# Patient Record
Sex: Male | Born: 1970 | Race: White | Hispanic: No | Marital: Married | Smoking: Former smoker
Health system: Southern US, Community
[De-identification: ages and names within clinical notes are randomized; demographics above are authoritative.]

## PROBLEM LIST (undated history)

## (undated) DIAGNOSIS — K219 Gastro-esophageal reflux disease without esophagitis: Secondary | ICD-10-CM

## (undated) DIAGNOSIS — T7840XA Allergy, unspecified, initial encounter: Secondary | ICD-10-CM

## (undated) DIAGNOSIS — J449 Chronic obstructive pulmonary disease, unspecified: Secondary | ICD-10-CM

## (undated) DIAGNOSIS — H9192 Unspecified hearing loss, left ear: Secondary | ICD-10-CM

## (undated) DIAGNOSIS — A6920 Lyme disease, unspecified: Secondary | ICD-10-CM

## (undated) DIAGNOSIS — R55 Syncope and collapse: Secondary | ICD-10-CM

## (undated) DIAGNOSIS — E7800 Pure hypercholesterolemia, unspecified: Secondary | ICD-10-CM

## (undated) DIAGNOSIS — G500 Trigeminal neuralgia: Secondary | ICD-10-CM

## (undated) DIAGNOSIS — R569 Unspecified convulsions: Secondary | ICD-10-CM

## (undated) HISTORY — DX: Lyme disease, unspecified: A69.20

## (undated) HISTORY — DX: Unspecified convulsions: R56.9

---
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
## 2013-01-28 ENCOUNTER — Encounter (HOSPITAL_COMMUNITY): Payer: Self-pay | Admitting: Family Medicine

## 2013-01-28 ENCOUNTER — Emergency Department (HOSPITAL_COMMUNITY): Payer: BC Managed Care – PPO

## 2013-01-28 ENCOUNTER — Emergency Department (HOSPITAL_COMMUNITY)
Admission: EM | Admit: 2013-01-28 | Discharge: 2013-01-28 | Disposition: A | Discharge status: Home / Self Care | Payer: BC Managed Care – PPO | Attending: Emergency Medicine | Admitting: Emergency Medicine

## 2013-01-28 DIAGNOSIS — S01311A Laceration without foreign body of right ear, initial encounter: Secondary | ICD-10-CM

## 2013-01-28 DIAGNOSIS — Y92009 Unspecified place in unspecified non-institutional (private) residence as the place of occurrence of the external cause: Secondary | ICD-10-CM | POA: Insufficient documentation

## 2013-01-28 DIAGNOSIS — R0781 Pleurodynia: Secondary | ICD-10-CM

## 2013-01-28 DIAGNOSIS — W19XXXA Unspecified fall, initial encounter: Secondary | ICD-10-CM | POA: Insufficient documentation

## 2013-01-28 DIAGNOSIS — IMO0002 Reserved for concepts with insufficient information to code with codable children: Secondary | ICD-10-CM | POA: Insufficient documentation

## 2013-01-28 DIAGNOSIS — S060X1A Concussion with loss of consciousness of 30 minutes or less, initial encounter: Secondary | ICD-10-CM | POA: Insufficient documentation

## 2013-01-28 DIAGNOSIS — S01309A Unspecified open wound of unspecified ear, initial encounter: Secondary | ICD-10-CM | POA: Insufficient documentation

## 2013-01-28 DIAGNOSIS — Y9389 Activity, other specified: Secondary | ICD-10-CM | POA: Insufficient documentation

## 2013-01-28 DIAGNOSIS — R55 Syncope and collapse: Secondary | ICD-10-CM | POA: Insufficient documentation

## 2013-01-28 LAB — POCT I-STAT, CHEM 8
Calcium, Ion: 1.16 mmol/L (ref 1.12–1.23)
Glucose, Bld: 99 mg/dL (ref 70–99)
HCT: 46 % (ref 39.0–52.0)
Hemoglobin: 15.6 g/dL (ref 13.0–17.0)
Potassium: 4.4 mEq/L (ref 3.5–5.1)

## 2013-01-28 MED ORDER — HYDROCODONE-ACETAMINOPHEN 5-325 MG PO TABS: 2.0000 | ORAL_TABLET | ORAL | Status: DC | PRN

## 2013-01-28 MED ORDER — HYDROCODONE-ACETAMINOPHEN 5-325 MG PO TABS
1.0000 | ORAL_TABLET | Freq: Once | ORAL | Status: AC
  Administered 2013-01-28: 1 via ORAL
  Filled 2013-01-28: qty 1

## 2013-01-28 NOTE — ED Provider Notes (Signed)
History     CSN: [id number]  Arrival date & time 01/28/13  1204   First MD Initiated Contact with Patient 01/28/13 1219      Chief Complaint  Patient presents with  . Loss of Consciousness    (Consider location/radiation/quality/duration/timing/severity/associated sxs/prior treatment) HPI  42 year old male presents for evaluations of a syncopal episode. Patient reports after showering this morning and while changing his clothes, patient heard a ringing sound in his left ear and subsequently found himself on the ground after a suspected syncopal episode. He denies any confusion afterward but complaining of pain and bleeding to his right ear lobe, and right rib pain. He also endorsed a headache on the right side from hitting his head on the wooden floor.  Sts he has been having a sinus infection with intermittent ringing in ear for several weeks.  He has been on steroid and antibiotic for it and just finished the course 5 days ago.  Still endorse occasional ringing in ear. Endorsed decreased hearing to L ear since he was diagnosed with sinus infection.  He also reports having trigeminal type pain for a the past few months and was found to have a crack tooth.  Once the tooth was removed his trigeminal type pain resolved.  However, for the past several weeks he has had pain to his R upper tooth which he concerns may have been the cause of his sinus pain as well.  Pt also reports having strong family hx of cardiac disease.  He also reports having 2 prior episodes of exertional syncope, no prior work up.  Pt is a nonsmoker.  Currently denies fever, chills, vision changes, n/v/d, sob, abd pain, back pain, weakness or numbness.  No prior hx of seizure, no tongue biting or urinary/bowel incontinence.    History reviewed. No pertinent past medical history.  Past Surgical History  Procedure Laterality Date  . Appendectomy      History reviewed. No pertinent family history.  History  Substance Use  Topics  . Smoking status: Never Smoker   . Smokeless tobacco: Not on file  . Alcohol Use: Yes     Comment: occ      Review of Systems  Constitutional:       A complete 10 system review of systems was obtained and all systems are negative except as noted in the HPI and PMH.    Allergies  Prednisone  Home Medications  No current outpatient prescriptions on file.  BP 125/69  Pulse 88  Temp(Src) 98.3 F (36.8 C)  Resp 18  SpO2 100%  Physical Exam  Nursing note and vitals reviewed. Constitutional: He is oriented to person, place, and time. He appears well-developed and well-nourished. No distress.  HENT:  Head: Normocephalic.  Right Ear: Tympanic membrane and ear canal normal.  Left Ear: Tympanic membrane, external ear and ear canal normal.  Ears:  No hemotympanum, no septal hematoma, no midface tenderness, no trismus or malocclusion  Eyes: Conjunctivae and EOM are normal. Pupils are equal, round, and reactive to light.  Neck: Normal range of motion. Neck supple.  Cardiovascular: Normal rate and regular rhythm.   Pulmonary/Chest: Effort normal and breath sounds normal. No respiratory distress. He exhibits tenderness (tenderness to R lateral chest wall without crepitus, emphysema or overlying skin changes.  ).  Abdominal: Soft. There is no tenderness.  Musculoskeletal: Normal range of motion. He exhibits no edema and no tenderness.  Neurological: He is alert and oriented to person, place, and time. He has  normal strength. No cranial nerve deficit or sensory deficit. He displays a negative Romberg sign. Coordination and gait normal. GCS eye subscore is 4. GCS verbal subscore is 5. GCS motor subscore is 6.  5/5 strength to all 4 extremities  Normal finger to nose/heel to shin coordination  Skin: Skin is warm.  Psychiatric: He has a normal mood and affect.    ED Course  Procedures (including critical care time)   Date: 01/28/2013  Rate: 89  Rhythm: normal sinus rhythm   QRS Axis: normal  Intervals: normal  ST/T Wave abnormalities: normal  Conduction Disutrbances:none  Narrative Interpretation:   Old EKG Reviewed: none available  LACERATION REPAIR Performed by: Fayrene Helper Authorized byFayrene Helper Consent: Verbal consent obtained. Risks and benefits: risks, benefits and alternatives were discussed Consent given by: patient Patient identity confirmed: provided demographic data Prepped and Draped in normal sterile fashion Wound explored  Laceration Location: R upper earlobe  Laceration Length: 3cm  No Foreign Bodies seen or palpated  Anesthesia: none  Local anesthetic: none  Anesthetic total: none  Irrigation method: syringe Amount of cleaning: standard  Skin closure: dermabond and sterile tape  Number of sutures: dermabond and sterile tape  Technique: dermabond and sterile tape  Patient tolerance: Patient tolerated the procedure well with no immediate complications.   1:21 PM Patient presents with syncopal episode. He suffered a laceration to his right ear lobe, and suspect right rib fracture from the fall. Head CT and rib x-ray ordered.  Patient reports equal episode, and also report a prior history of exertional syncope with no prior work up. This has a significant family history of cardiac disease. The plan is to obtain chest x-ray, EKG. Patient would benefit from further outpt cardiac workup to rule out HCM.  Care discussed with attending.   2:38 PM Patient currently in no acute distress. Pain is well-controlled. He has no evidence of anemia on lab. Head CT scan is unremarkable. No evidence of rib fx on x-ray. No evidence of cardiomegaly, and EKG is unremarkable.  I suspect patient will benefit from ENT evaluation due to his complaints of hearing loss, and tinnitus. Referral given. I also recommend patient to followup with his primary care Dr. and with cardiologist for further evaluation of his syncope. Patient voiced  understanding and agrees with plan.    Labs Reviewed  POCT I-STAT, CHEM 8   Dg Ribs Unilateral W/chest Right  01/28/2013  *RADIOLOGY REPORT*  Clinical Data: Fall and right rib pain.  RIGHT RIBS AND CHEST - 3+ VIEW  Comparison: None.  Findings: Chest radiograph demonstrates clear lungs. Heart and mediastinum are within normal limits.  No evidence for a pneumothorax.  Mild scarring at the lung apices.  No evidence for a displaced right rib fracture.  IMPRESSION: No acute abnormalities.   Original Report Authenticated By: Richarda Overlie, M.D.    Ct Head Wo Contrast  01/28/2013  *RADIOLOGY REPORT*  Clinical Data:  Syncopal episode.  Fell and hit head.  CT HEAD WITHOUT CONTRAST  Technique:  Contiguous axial images were obtained from the base of the skull through the vertex without contrast  Comparison:  None.  Findings:  The brain has a normal appearance without evidence for hemorrhage, acute infarction, hydrocephalus, or mass lesion.  There is no extra axial fluid collection.  The skull and paranasal sinuses are normal.  IMPRESSION: Normal CT of the head without contrast.   Original Report Authenticated By: Davonna Belling, M.D.      1. Syncope  2. Ear lobe laceration, right, initial encounter   3. Rib pain on right side   4. Concussion, with loss of consciousness of 30 minutes or less, initial encounter       MDM  BP 116/83  Pulse 78  Temp(Src) 98.3 F (36.8 C) (Oral)  Resp 17  SpO2 97%  I have reviewed nursing notes and vital signs. I personally reviewed the imaging tests through PACS system  I reviewed available ER/hospitalization records thought the EMR         Fayrene Helper, [place] 01/28/13 1516

## 2013-01-28 NOTE — ED Notes (Signed)
Per pt sts syncopal episode this am and fell and hit head. Lac to right ear with bleeding controlled. sts right rib pain. Sts has been having inner ear issues lately.

## 2013-01-29 NOTE — ED Provider Notes (Signed)
Medical screening examination/treatment/procedure(s) were conducted as a shared visit with non-physician practitioner(s) and myself.  I personally evaluated the patient during the encounter.  Syncopal spell as described by physician assistant.   Normal physical exam in the emergency department.   Patient has primary care followup  Donnetta Hutching, MD 01/29/13 [phone]

## 2013-03-13 DIAGNOSIS — Y9389 Activity, other specified: Secondary | ICD-10-CM | POA: Insufficient documentation

## 2013-03-13 DIAGNOSIS — Y92009 Unspecified place in unspecified non-institutional (private) residence as the place of occurrence of the external cause: Secondary | ICD-10-CM | POA: Insufficient documentation

## 2013-03-13 DIAGNOSIS — W261XXA Contact with sword or dagger, initial encounter: Secondary | ICD-10-CM | POA: Insufficient documentation

## 2013-03-13 DIAGNOSIS — W260XXA Contact with knife, initial encounter: Secondary | ICD-10-CM | POA: Insufficient documentation

## 2013-03-13 DIAGNOSIS — Z8669 Personal history of other diseases of the nervous system and sense organs: Secondary | ICD-10-CM | POA: Insufficient documentation

## 2013-03-13 DIAGNOSIS — S61209A Unspecified open wound of unspecified finger without damage to nail, initial encounter: Secondary | ICD-10-CM | POA: Insufficient documentation

## 2013-03-14 ENCOUNTER — Emergency Department (HOSPITAL_COMMUNITY)
Admission: EM | Admit: 2013-03-14 | Discharge: 2013-03-14 | Disposition: A | Discharge status: Home / Self Care | Payer: BC Managed Care – PPO | Attending: Emergency Medicine | Admitting: Emergency Medicine

## 2013-03-14 ENCOUNTER — Encounter (HOSPITAL_COMMUNITY): Payer: Self-pay | Admitting: Adult Health

## 2013-03-14 DIAGNOSIS — S61011A Laceration without foreign body of right thumb without damage to nail, initial encounter: Secondary | ICD-10-CM

## 2013-03-14 HISTORY — DX: Syncope and collapse: R55

## 2013-03-14 NOTE — ED Provider Notes (Signed)
History     CSN: [id number]  Arrival date & time 03/13/13  2335   First MD Initiated Contact with Patient 03/14/13 0030      Chief Complaint  Patient presents with  . Laceration   HPI   history provided by the patient. Patient is a 42 year old male with no significant PMH who presents with laceration to the tip of his right thumb. Patient states that he was closing a pocket knife after using it when it closed slicing the top part of the tip of his right thumb. He reports having significant bleeding was very difficult to control a home and made him more concerned about the cut. He denies any other injuries. No weakness or numbness to the finger. He is current on his tetanus shot.    Past Medical History  Diagnosis Date  . Syncope     Past Surgical History  Procedure Laterality Date  . Appendectomy      History reviewed. No pertinent family history.  History  Substance Use Topics  . Smoking status: Never Smoker   . Smokeless tobacco: Not on file  . Alcohol Use: Yes     Comment: occ      Review of Systems  Neurological: Negative for weakness and numbness.  All other systems reviewed and are negative.    Allergies  Prednisone  Home Medications  No current outpatient prescriptions on file.  BP 112/93  Pulse 76  Temp(Src) 98.4 F (36.9 C) (Oral)  Resp 16  SpO2 98%  Physical Exam  Nursing note and vitals reviewed. Constitutional: He is oriented to person, place, and time. He appears well-developed and well-nourished. No distress.  HENT:  Head: Normocephalic.  Cardiovascular: Normal rate and regular rhythm.   Pulmonary/Chest: Effort normal and breath sounds normal.  Abdominal: Soft.  Musculoskeletal: Normal range of motion.  Laceration through the dorsal side of the right thumb tip. No active bleeding.   Neurological: He is alert and oriented to person, place, and time.  Skin: Skin is warm.  Psychiatric: He has a normal mood and affect. His behavior is  normal.    ED Course  Procedures   LACERATION REPAIR Performed by: Angus Seller Authorized by: Angus Seller Consent: Verbal consent obtained. Risks and benefits: risks, benefits and alternatives were discussed Consent given by: patient Patient identity confirmed: provided demographic data Prepped and Draped in normal sterile fashion Wound explored  Laceration Location: Tip of right thumb  Laceration Length: 1 cm  No Foreign Bodies seen or palpated  Anesthesia: Digital block   Local anesthetic: lidocaine 2% without epinephrine  Anesthetic total: 8 ml  Irrigation method: syringe Amount of cleaning: standard  Skin closure: Skin with 6-0 Prolene   Number of sutures: 4   Technique: Simple interrupted   Patient tolerance: Patient tolerated the procedure well with no immediate complications.   1. Laceration of thumb, right, initial encounter       MDM  Patient seen and evaluated. Patient well-appearing in no acute distress. Patient reports being current on tetanus shot.     Angus Seller, PA-C 03/14/13 [phone]
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
## 2013-03-14 NOTE — ED Provider Notes (Signed)
Medical screening examination/treatment/procedure(s) were performed by non-physician practitioner and as supervising physician I was immediately available for consultation/collaboration.  Flint Melter, MD 03/14/13 [phone]

## 2013-07-14 ENCOUNTER — Emergency Department (HOSPITAL_COMMUNITY)
Admission: EM | Admit: 2013-07-14 | Discharge: 2013-07-14 | Disposition: A | Discharge status: Home / Self Care | Payer: BC Managed Care – PPO | Attending: Emergency Medicine | Admitting: Emergency Medicine

## 2013-07-14 ENCOUNTER — Emergency Department (HOSPITAL_COMMUNITY): Payer: BC Managed Care – PPO

## 2013-07-14 ENCOUNTER — Encounter (HOSPITAL_COMMUNITY): Payer: Self-pay | Admitting: *Deleted

## 2013-07-14 DIAGNOSIS — R296 Repeated falls: Secondary | ICD-10-CM | POA: Insufficient documentation

## 2013-07-14 DIAGNOSIS — Y9289 Other specified places as the place of occurrence of the external cause: Secondary | ICD-10-CM | POA: Insufficient documentation

## 2013-07-14 DIAGNOSIS — Z8669 Personal history of other diseases of the nervous system and sense organs: Secondary | ICD-10-CM | POA: Insufficient documentation

## 2013-07-14 DIAGNOSIS — S82009A Unspecified fracture of unspecified patella, initial encounter for closed fracture: Secondary | ICD-10-CM | POA: Insufficient documentation

## 2013-07-14 DIAGNOSIS — S82001A Unspecified fracture of right patella, initial encounter for closed fracture: Secondary | ICD-10-CM

## 2013-07-14 DIAGNOSIS — Y939 Activity, unspecified: Secondary | ICD-10-CM | POA: Insufficient documentation

## 2013-07-14 DIAGNOSIS — Z87891 Personal history of nicotine dependence: Secondary | ICD-10-CM | POA: Insufficient documentation

## 2013-07-14 HISTORY — DX: Unspecified hearing loss, left ear: H91.92

## 2013-07-14 MED ORDER — HYDROCODONE-ACETAMINOPHEN 5-325 MG PO TABS: ORAL_TABLET | ORAL | Status: DC

## 2013-07-14 NOTE — ED Notes (Signed)
Pt fell on R knee.  Pain continues to increase.  Abrasions and mild swelling noted to R knee.
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
## 2013-07-15 NOTE — ED Provider Notes (Signed)
Medical screening examination/treatment/procedure(s) were performed by non-physician practitioner and as supervising physician I was immediately available for consultation/collaboration.   Mackensi Mahadeo, MD 07/15/13 2325 

## 2014-05-19 IMAGING — CR DG RIBS W/ CHEST 3+V*R*
4 series · 4 of 4 positions shown · non-contrast
Comparison: None.

CLINICAL DATA: Fall and right rib pain.

RIGHT RIBS AND CHEST - 3+ VIEW

[w chest pa]
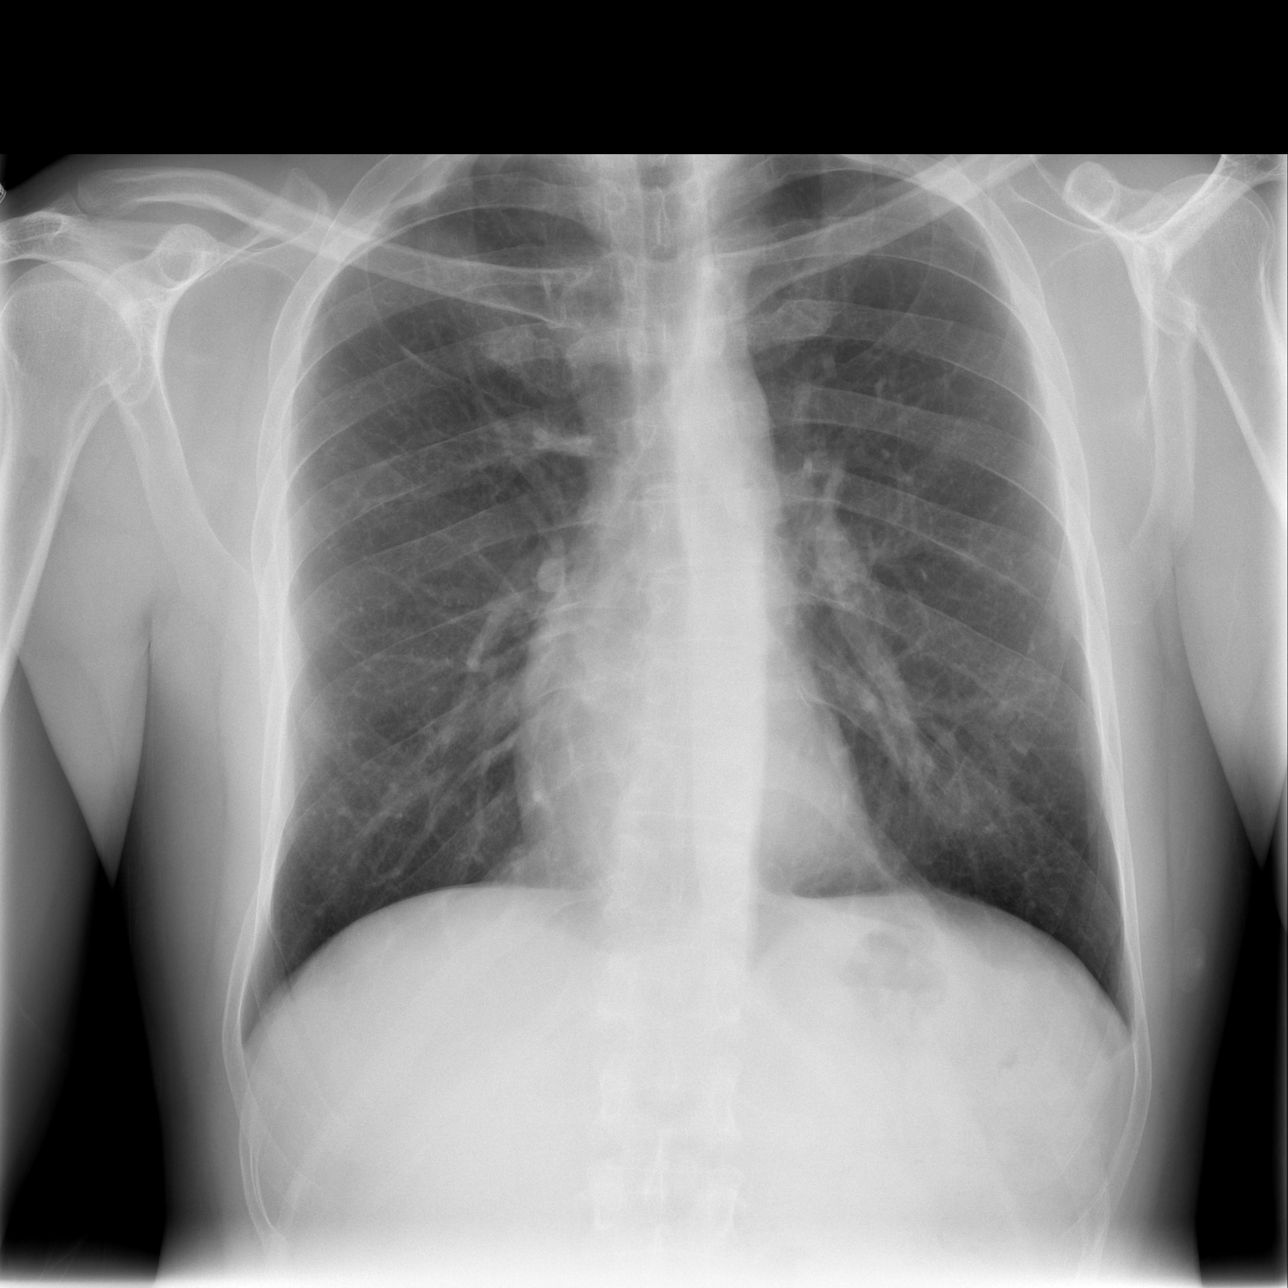

[w ribs ap/pa upper right]
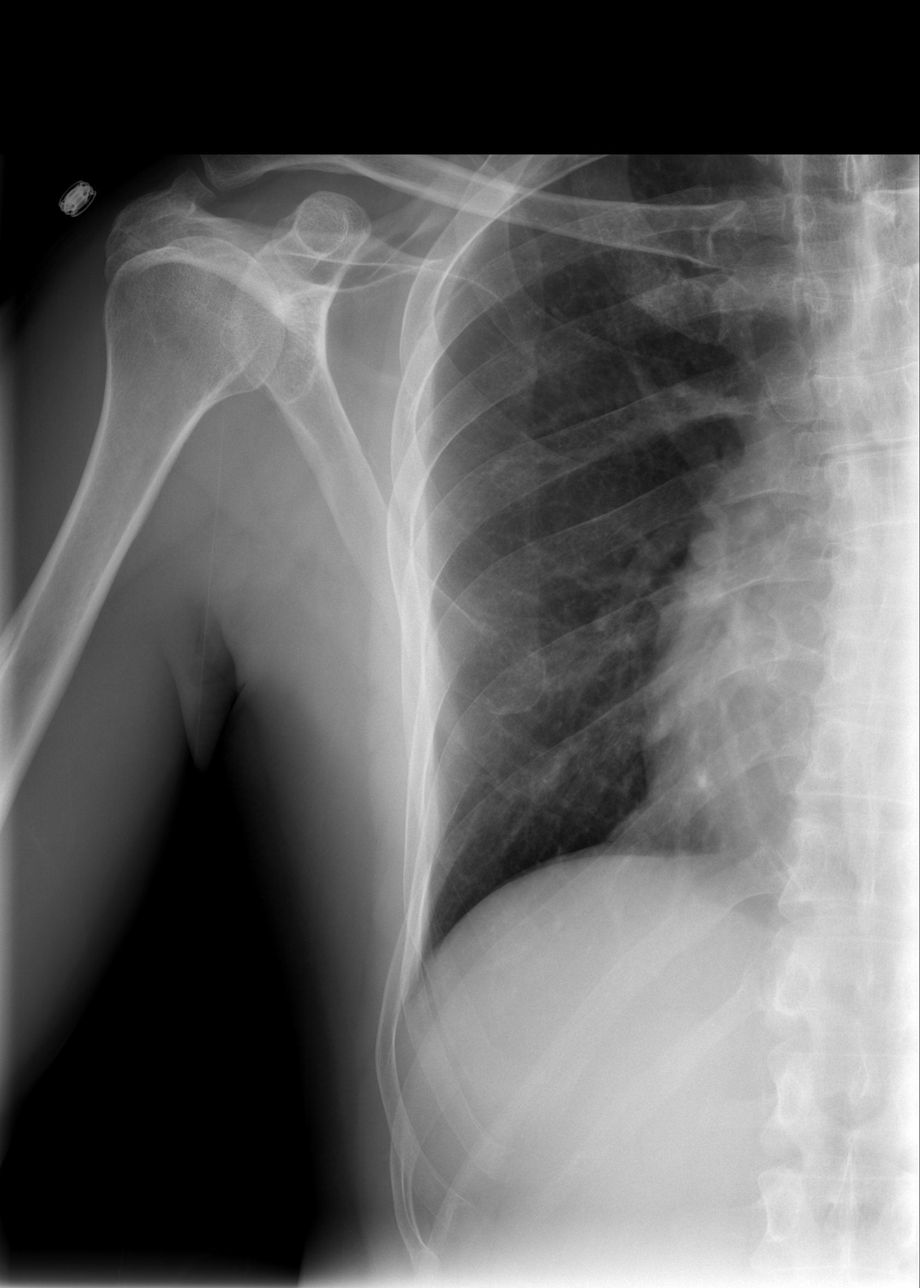

[w ribs ap/pa lower right]
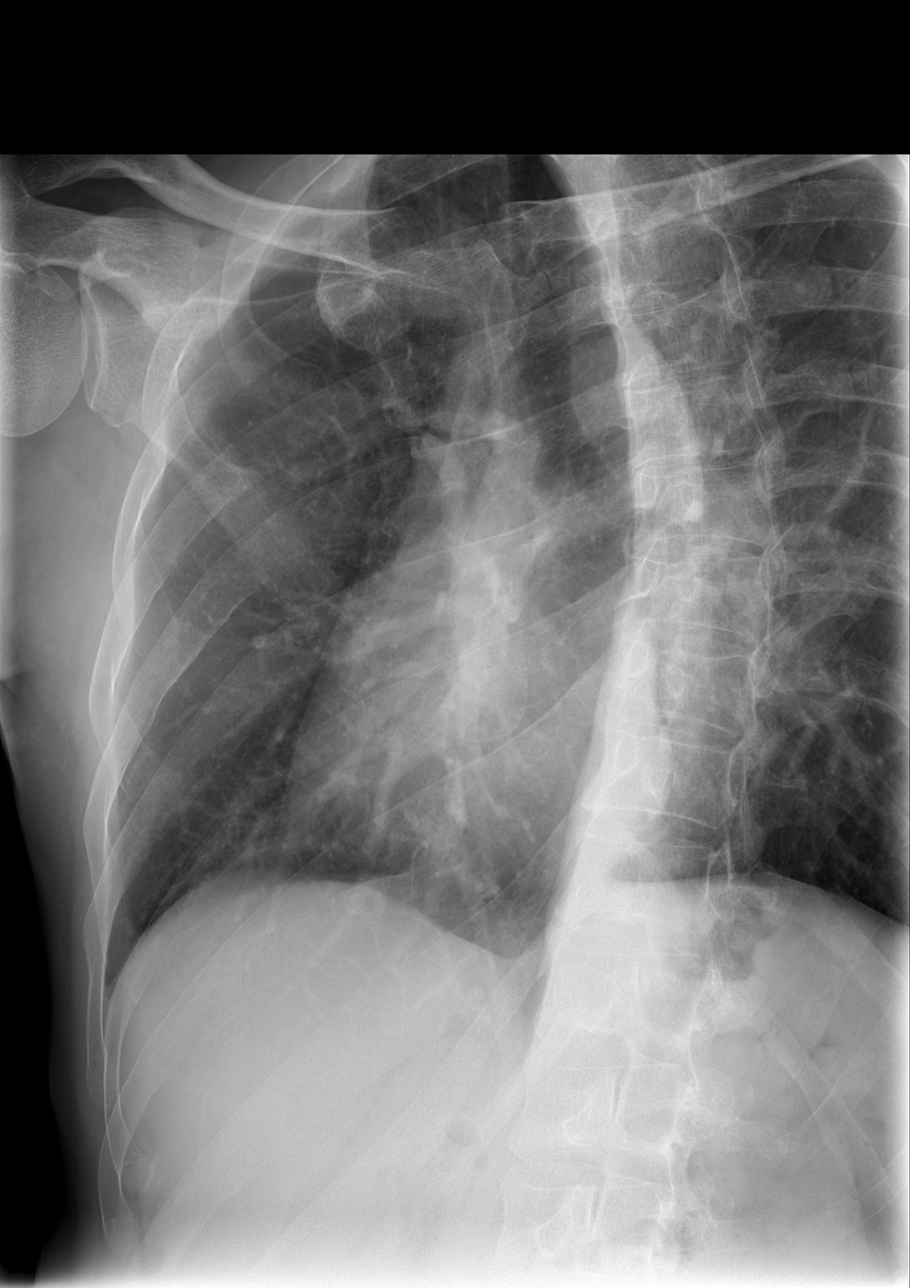

[w ribs oblique right]
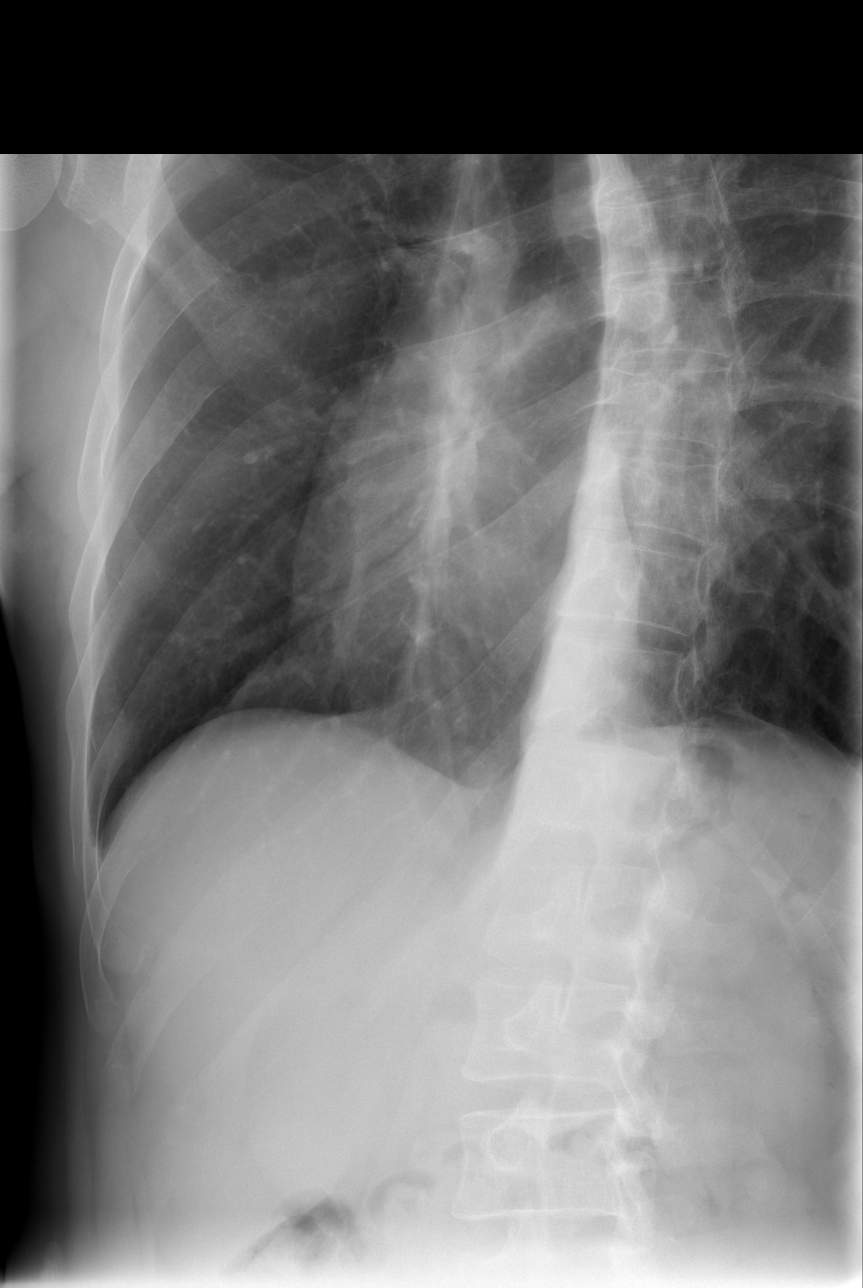

[4 of 4 positions shown; findings below may reference images not displayed]

FINDINGS: Chest radiograph demonstrates clear lungs. Heart and
mediastinum are within normal limits.  No evidence for a
pneumothorax.  Mild scarring at the lung apices.  No evidence for a
displaced right rib fracture.
IMPRESSION: No acute abnormalities.

## 2014-11-20 ENCOUNTER — Other Ambulatory Visit: Payer: Self-pay | Admitting: Family

## 2014-11-20 ENCOUNTER — Ambulatory Visit
Admission: RE | Admit: 2014-11-20 | Discharge: 2014-11-20 | Disposition: A | Discharge status: Home / Self Care | Payer: BLUE CROSS/BLUE SHIELD | Source: Ambulatory Visit | Attending: Family | Admitting: Family

## 2014-11-20 DIAGNOSIS — R079 Chest pain, unspecified: Secondary | ICD-10-CM

## 2014-11-25 ENCOUNTER — Encounter: Payer: Self-pay | Admitting: Internal Medicine

## 2014-11-25 ENCOUNTER — Ambulatory Visit (INDEPENDENT_AMBULATORY_CARE_PROVIDER_SITE_OTHER): Payer: BLUE CROSS/BLUE SHIELD | Admitting: Internal Medicine

## 2014-11-25 VITALS — BP 112/72 | HR 80 | Ht 71.5 in | Wt 184.0 lb

## 2014-11-25 DIAGNOSIS — R0600 Dyspnea, unspecified: Secondary | ICD-10-CM | POA: Insufficient documentation

## 2014-11-25 NOTE — Patient Instructions (Signed)
Pantoprazole (protonix) 40 mg   Take 30-60 min before first meal of the day and Pepcid 20 mg one bedtime until return to office - this is the best way to tell whether stomach acid is contributing to your problem.    GERD (REFLUX)  is an extremely common cause of respiratory symptoms just like yours , many times with no obvious heartburn at all.    It can be treated with medication, but also with lifestyle changes including avoidance of late meals, excessive alcohol, smoking cessation, and avoid fatty foods, chocolate, peppermint, colas, red wine, and acidic juices such as orange juice.  NO MINT OR MENTHOL PRODUCTS SO NO COUGH DROPS  USE SUGARLESS CANDY INSTEAD (Jolley ranchers or Stover's or Life Savers) or even ice chips will also do - the key is to swallow to prevent all throat clearing. NO OIL BASED VITAMINS - use powdered substitutes.   Please see patient coordinator before you leave today  to schedule CT angiographic

## 2014-11-25 NOTE — Assessment & Plan Note (Signed)
Symptoms are markedly disproportionate to objective findings and not clear this is a lung problem but pt does appear to have difficult airway management issues. DDX of  difficult airways management all start with A and  include Adherence, Ace Inhibitors, Acid Reflux, Active Sinus Disease, Alpha 1 Antitripsin deficiency, Anxiety masquerading as Airways dz,  ABPA,  allergy(esp in young), Aspiration (esp in elderly), Adverse effects of DPI,  Active smokers, plus two Bs  = Bronchiectasis and Beta blocker use..and one C= CHF   Adherence is always the initial "prime suspect" and is a multilayered concern that requires a "trust but verify" approach in every patient - starting with knowing how to use medications, especially inhalers, correctly, keeping up with refills and understanding the fundamental difference between maintenance and prns vs those medications only taken for a very short course and then stopped and not refilled.   ? Acid (or non-acid) GERD > always difficult to exclude as up to 75% of pts in some series report no assoc GI/ Heartburn symptoms> rec max (24h)  acid suppression and diet restrictions/ reviewed and instructions given in writing.   ? 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   See instructions for specific recommendations which were reviewed directly with the patient who was given a copy with highlighter outlining the key components.

## 2014-11-25 NOTE — Progress Notes (Signed)
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 Pulmonary Consult    Pt referred by Dr. Boneta Reilly.  Pt c/o CP on and off for the past several months- constant pain for the past 3-4 wks "burning and sometimes sharp pain".  He also c/o SOB with or without exertion for the past month. He gets out of breath with walking up one flight of stairs. Pt c/o cough- non prod.   doe  x months getting worse and pain more constant x 4 weeks  Pain radiates to neck when lie down,  tums helped the hb but not the pain, pain no worse with exertion but sob is PC ant slt r of midline just below angle of Louie, Not related meals/ not pleuritic. Pain no worse lying down but radiates up to neck most nights within an hour of being placed supine.  Assoc with dry day > noct cough and sensation of lung crackling when supine   No obvious other patterns in day to day or daytime variabilty or assoc   chest tightness, subjective wheeze overt sinus or hb symptoms. No unusual exp hx or h/o childhood pna/  knowledge of premature birth.  Sleeping ok without nocturnal  or early am exacerbation  of respiratory  c/o's or need for noct saba. Also denies any obvious fluctuation of symptoms with weather or environmental changes or other aggravating or alleviating factors except as outlined above   Current Medications, Allergies, Complete Past Medical History, Past Surgical History, Family History, and Social History were  reviewed in [employer] record.               Review of Systems  Constitutional: Positive for appetite change. Negative for fever, chills, activity change and unexpected weight change.  HENT: Positive for sore throat. Negative for congestion, dental problem, postnasal drip, rhinorrhea, sneezing, trouble swallowing and voice change.   Eyes: Negative for visual disturbance.  Respiratory: Positive for cough and shortness of breath. Negative for choking.   Cardiovascular: Positive for chest pain. Negative for leg swelling.  Gastrointestinal: Negative for nausea, vomiting and abdominal pain.  Genitourinary: Negative for difficulty urinating.       Acid heartburn  Musculoskeletal: Positive for arthralgias.  Skin: Negative for rash.  Psychiatric/Behavioral: Negative for behavioral problems and confusion.       Objective:   Physical Exam amb wm nad  HEENT: nl dentition, turbinates, and orophanx. Nl external ear canals without cough reflex   NECK :  without JVD/Nodes/TM/ nl carotid upstrokes bilaterally   LUNGS: no acc muscle use, clear to A and P bilaterally without cough on insp or exp maneuvers   CV:  RRR  no s3 or murmur or increase in P2, no edema   ABD:  soft and nontender with nl excursion in the supine position. No bruits or organomegaly, bowel sounds nl  MS:  warm without deformities, calf tenderness, cyanosis or  clubbing  SKIN: warm and dry without lesions    NEURO:  alert, approp, no deficits    CXR:  11/20/14  I personally reviewed images and agree with radiology impression as follows:   e heart size and mediastinal contours are stable. The heart is relatively midline. Aortic arch is left-sided. The lungs are mildly hyperinflated with stable biapical pleural thickening. No airspace disease, pleural effusion or pneumothorax demonstrated. The bones appear intact. Add:  I think this is a typo:  He appears to have   a right sided arch and  large R heart on PA so ? Is this situs inversus    Assessment & Plan:

## 2014-11-26 ENCOUNTER — Ambulatory Visit (INDEPENDENT_AMBULATORY_CARE_PROVIDER_SITE_OTHER)
Admission: RE | Admit: 2014-11-26 | Discharge: 2014-11-26 | Disposition: A | Discharge status: Home / Self Care | Payer: BLUE CROSS/BLUE SHIELD | Source: Ambulatory Visit | Attending: Internal Medicine | Admitting: Internal Medicine

## 2014-11-26 DIAGNOSIS — R0600 Dyspnea, unspecified: Secondary | ICD-10-CM

## 2014-11-26 MED ORDER — IOHEXOL 350 MG/ML SOLN
80.0000 mL | Freq: Once | INTRAVENOUS | Status: AC | PRN
  Administered 2014-11-26: 80 mL via INTRAVENOUS

## 2014-11-27 NOTE — Progress Notes (Signed)
Quick Note:  Spoke with pt and notified of results per Dr. Wert. Pt verbalized understanding and denied any questions.  ______ 

## 2014-11-28 ENCOUNTER — Telehealth: Payer: Self-pay | Admitting: Internal Medicine

## 2014-11-28 MED ORDER — PANTOPRAZOLE SODIUM 40 MG PO TBEC: 40.0000 mg | DELAYED_RELEASE_TABLET | Freq: Every day | ORAL | Status: DC

## 2014-11-28 NOTE — Telephone Encounter (Signed)
Pt aware RX sent in. Nothing further needed 

## 2014-11-29 ENCOUNTER — Institutional Professional Consult (permissible substitution): Payer: BLUE CROSS/BLUE SHIELD | Admitting: Internal Medicine

## 2014-12-04 ENCOUNTER — Encounter: Payer: Self-pay | Admitting: Internal Medicine

## 2014-12-04 ENCOUNTER — Ambulatory Visit (INDEPENDENT_AMBULATORY_CARE_PROVIDER_SITE_OTHER): Payer: BLUE CROSS/BLUE SHIELD | Admitting: Internal Medicine

## 2014-12-04 VITALS — BP 112/70 | HR 78 | Ht 71.5 in | Wt 187.0 lb

## 2014-12-04 DIAGNOSIS — Z23 Encounter for immunization: Secondary | ICD-10-CM

## 2014-12-04 DIAGNOSIS — R0600 Dyspnea, unspecified: Secondary | ICD-10-CM

## 2014-12-04 NOTE — Patient Instructions (Addendum)
No change in medications or diet   Best cough medication delsym 2tsp every 12 hours as needed but the goal is no coughing at all   Please schedule a follow up office visit in 4 weeks, sooner if needed with pfts on return

## 2014-12-04 NOTE — Progress Notes (Signed)
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 Pulmonary Consult    Pt referred by Dr. Boneta Lucks.  Pt c/o CP on and off for the past several months- constant pain for the past 3-4 wks "burning and sometimes sharp pain".  He also c/o SOB with or without exertion for the past month. He gets out of breath with walking up one flight of stairs. Pt c/o cough- non prod.   doe  x months getting worse and pain more constant x 4 weeks  Pain radiates to neck when lie down,  tums helped the hb but not the pain, pain no worse with exertion but sob is PC ant slt r of midline just below angle of Louie, Not related meals/ not pleuritic. Pain no worse lying down but radiates up to neck most nights within an hour of being placed supine.  Assoc with dry day > noct cough and sensation of lung crackling when supine  rec Pantoprazole (protonix) 40 mg   Take 30-60 min before first meal of the day and Pepcid 20 mg one bedtime until return to office - this is the best way to tell whether stomach acid is contributing to your problem.   GERD diet   Please see patient coordinator before you leave today  to schedule CT angiographic    12/04/2014 f/u ov/Madysun Thall re: unexplained cp p coughing x months onset ? Sept 2015  Chief Complaint  Patient presents with  . Follow-up    Pt states that his SOB and CP have improved some, but have not resolved.     Not limited  by breathing from desired activities  - diffuse cp is directly related to cough, not exertion, is bilateral ant and no assoc nausea/ diaphoresis, present everytim he coughs hard x months now   No obvious day to day or daytime variabilty or assoc chest tightness, subjective wheeze overt sinus or hb symptoms. No unusual exp hx or h/o childhood pna/ asthma or knowledge of premature birth.  Sleeping ok without nocturnal  or early am exacerbation  of respiratory  c/o's or need for noct saba. Also denies any obvious fluctuation of symptoms with weather or environmental changes or other aggravating or alleviating factors except as outlined above   Current Medications, Allergies, Complete Past Medical History, Past Surgical History, Family History, and Social History were reviewed in [employer] record.  ROS  The following are not active complaints unless bolded sore throat, dysphagia, dental problems, itching, sneezing,  nasal congestion or excess/ purulent secretions, ear ache,   fever, chills, sweats, unintended wt loss, classically lateralizing/ pleuritic or exertional cp, hemoptysis,  orthopnea pnd or leg swelling, presyncope, palpitations, heartburn, abdominal pain, anorexia, nausea, vomiting, diarrhea  or change in bowel or urinary habits, change in stools or urine, dysuria,hematuria,  rash, arthralgias, visual complaints, headache, numbness weakness or  ataxia or problems with walking or coordination,  change in mood/affect or memory.           Objective:   Physical Exam amb very healthy appearing wm nad  Wt Readings from Last 3 Encounters:  12/04/14 187 lb (84.823 kg)  11/25/14 184 lb (83.462 kg)    Vital signs reviewed   HEENT: nl dentition, turbinates, and orophanx. Nl external ear canals without cough reflex   NECK :  without JVD/Nodes/TM/ nl carotid upstrokes bilaterally   LUNGS: no acc muscle use, clear to A and P bilaterally without cough on insp or exp  maneuvers   CV:  RRR  no s3 or murmur or increase in P2, no edema   ABD:  soft and nontender with nl excursion in the supine position. No bruits or organomegaly, bowel sounds nl  MS:  warm without deformities, calf tenderness, cyanosis or clubbing  SKIN: warm and dry without lesions    NEURO:  alert, approp, no deficits      I personally reviewed images and agree with radiology impression as follows:  CTa 11/26/14  Mediastinum/Nodes: The quality of this exam for evaluation of pulmonary embolism is excellent. No evidence of pulmonary embolism.  Bovine arch. Normal caliber of the thoracic aorta. No gross dissection; portions of the aorta suboptimally opacified secondary to bolus timing.  Normal heart size without pericardial or pleural effusion. No mediastinal or hilar adenopathy.  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 No airspace opacities. No pleural fluid.    Assessment & Plan:

## 2014-12-08 ENCOUNTER — Encounter: Payer: Self-pay | Admitting: Internal Medicine

## 2014-12-08 NOTE — Assessment & Plan Note (Signed)
The overall pattern of cough to point of cp and sob with clear lungs on exam is typical of  Classic Upper airway cough syndrome, so named because it's frequently impossible to sort out how much is  CR/sinusitis with freq throat clearing (which can be related to primary GERD)   vs  causing  secondary (" extra esophageal")  GERD from wide swings in gastric pressure that occur with throat clearing, often  promoting self use of mint and menthol lozenges that reduce the lower esophageal sphincter tone and exacerbate the problem further in a cyclical fashion.   These are the same pts (now being labeled as having "irritable larynx syndrome" by some cough centers) who not infrequently have a history of having failed to tolerate ace inhibitors,  dry powder inhalers or biphosphonates or report having atypical reflux symptoms that don't respond to standard doses of PPI , and are easily confused as having aecopd or asthma flares by even experienced allergists/ pulmonologists.  For now continue max gerd rx/diet and f/u in 4 weeks with pfts but I stronlgy doubt he has sign copd and the bullous changes on the L apex would be very unlikely to explain his chronic cp.  See instructions for specific recommendations which were reviewed directly with the patient who was given a copy with highlighter outlining the key components.

## 2015-01-08 ENCOUNTER — Ambulatory Visit: Payer: BLUE CROSS/BLUE SHIELD | Admitting: Internal Medicine

## 2015-12-17 HISTORY — PX: GANGLION CYST EXCISION: SHX1691

## 2015-12-21 ENCOUNTER — Emergency Department (HOSPITAL_COMMUNITY)
Admission: EM | Admit: 2015-12-21 | Discharge: 2015-12-21 | Disposition: A | Discharge status: Home / Self Care | Payer: BLUE CROSS/BLUE SHIELD | Source: Home / Self Care | Attending: Family Medicine | Admitting: Family Medicine

## 2015-12-21 ENCOUNTER — Emergency Department (INDEPENDENT_AMBULATORY_CARE_PROVIDER_SITE_OTHER): Payer: BLUE CROSS/BLUE SHIELD

## 2015-12-21 ENCOUNTER — Encounter (HOSPITAL_COMMUNITY): Payer: Self-pay | Admitting: Emergency Medicine

## 2015-12-21 DIAGNOSIS — J069 Acute upper respiratory infection, unspecified: Secondary | ICD-10-CM | POA: Diagnosis not present

## 2015-12-21 HISTORY — DX: Trigeminal neuralgia: G50.0

## 2015-12-21 MED ORDER — IPRATROPIUM BROMIDE 0.06 % NA SOLN: 2.0000 | Freq: Four times a day (QID) | NASAL | Status: DC

## 2015-12-21 NOTE — Discharge Instructions (Signed)
Drink plenty of fluids as discussed, use medicine as prescribed, and mucinex or delsym for cough. Return or see your doctor if further problems °

## 2015-12-21 NOTE — ED Notes (Signed)
Saw pcp one week ago and prescribed doxycycline, last dose today and is no better.  Facial pressure, runny nose, throat sore with coughing.  No ear pain, but difficulty hearing.  A week ago had a productive cough, but that seems much better now

## 2015-12-21 NOTE — ED Provider Notes (Signed)
CSN: [id number]     Arrival date & time 12/21/15  1301 History   First MD Initiated Contact with Patient 12/21/15 1325     Chief Complaint  Patient presents with  . URI   (Consider location/radiation/quality/duration/timing/severity/associated sxs/prior Treatment) Patient is a 45 y.o. male presenting with URI. The history is provided by the patient.  URI Presenting symptoms: congestion, cough, facial pain and rhinorrhea   Presenting symptoms: no fever   Severity:  Mild Onset quality:  Gradual Duration:  2 weeks Progression:  Unchanged (given doxy 1 week ago by lmd without resolution of sx.) Chronicity:  New Ineffective treatments:  Prescription medications Associated symptoms: sinus pain     Past Medical History  Diagnosis Date  . Syncope   . Hearing loss in left ear   . Trigeminal neuralgia    Past Surgical History  Procedure Laterality Date  . Appendectomy  1986   Family History  Problem Relation Age of Onset  . Emphysema Maternal Aunt     smoked  . Allergies      "everybody"  . Heart disease Father   . Melanoma Father   . Prostate cancer Father    Social History  Substance Use Topics  . Smoking status: Former Smoker -- 0.75 packs/day for 13 years    Types: Cigarettes    Quit date: 10/25/2014  . Smokeless tobacco: Never Used  . Alcohol Use: 0.0 oz/week    0 Standard drinks or equivalent per week     Comment: occ    Review of Systems  Constitutional: Negative.  Negative for fever.  HENT: Positive for congestion, postnasal drip, rhinorrhea and sinus pressure.   Respiratory: Positive for cough. Negative for shortness of breath.   Cardiovascular: Negative.   Gastrointestinal: Negative.   All other systems reviewed and are negative.   Allergies  Prednisone  Home Medications   Prior to Admission medications   Medication Sig Start Date End Date Taking? Authorizing Provider  doxycycline (DORYX) 100 MG EC tablet Take 100 mg by mouth 2 (two) times daily.    Yes Historical Provider, MD  Pseudoephedrine-APAP-DM (DAYQUIL PO) Take by mouth.   Yes Historical Provider, MD  cyclobenzaprine (FLEXERIL) 10 MG tablet Take 10 mg by mouth 3 (three) times daily as needed for muscle spasms.    Historical Provider, MD  famotidine (PEPCID) 20 MG tablet Take 20 mg by mouth at bedtime.    Historical Provider, MD  ipratropium (ATROVENT) 0.06 % nasal spray Place 2 sprays into both nostrils 4 (four) times daily. 12/21/15   Linna HoffJames D Bronda Alfred, MD  pantoprazole (PROTONIX) 40 MG tablet Take 1 tablet (40 mg total) by mouth daily. Patient not taking: Reported on 12/21/2015 11/28/14   Nyoka CowdenMichael B Wert, MD   Meds Ordered and Administered this Visit  Medications - No data to display  BP 146/81 mmHg  Pulse 114  Temp(Src) 97.9 F (36.6 C) (Oral)  Resp 18  SpO2 97% No data found.   Physical Exam  Constitutional: He is oriented to person, place, and time. He appears well-developed and well-nourished. No distress.  HENT:  Right Ear: External ear normal.  Left Ear: External ear normal.  Nose: Nose normal.  Mouth/Throat: Oropharynx is clear and moist.  Eyes: Conjunctivae and EOM are normal. Pupils are equal, round, and reactive to light.  Neck: Normal range of motion. Neck supple.  Cardiovascular: Normal rate, regular rhythm, normal heart sounds and intact distal pulses.   Pulmonary/Chest: Effort normal.  Lymphadenopathy:  He has no cervical adenopathy.  Neurological: He is alert and oriented to person, place, and time.  Skin: Skin is warm and dry.  Nursing note and vitals reviewed.   ED Course  Procedures (including critical care time)  Labs Review Labs Reviewed - No data to display  Imaging Review Dg Sinuses Complete  12/21/2015  CLINICAL DATA:  Pt here with sinus infection x couple weeks, not getting any better, pt has had antibiotics x 1 week, today is the last day for meds EXAM: PARANASAL SINUSES - COMPLETE 3 + VIEW COMPARISON:  CT 01/28/2013 FINDINGS: The paranasal  sinus are aerated. There is no evidence of sinus opacification air-fluid levels or mucosal thickening. No significant bone abnormalities are seen. IMPRESSION: Negative. Electronically Signed   By: Corlis Leak M.D.   On: 12/21/2015 13:55     Visual Acuity Review  Right Eye Distance:   Left Eye Distance:   Bilateral Distance:    Right Eye Near:   Left Eye Near:    Bilateral Near:         MDM   1. URI (upper respiratory infection)        Linna Hoff, MD 12/21/15 [phone]

## 2016-02-24 ENCOUNTER — Encounter (HOSPITAL_COMMUNITY): Payer: Self-pay | Admitting: *Deleted

## 2016-02-24 ENCOUNTER — Emergency Department (HOSPITAL_COMMUNITY)
Admission: EM | Admit: 2016-02-24 | Discharge: 2016-02-24 | Disposition: A | Discharge status: Home / Self Care | Payer: BLUE CROSS/BLUE SHIELD | Attending: Emergency Medicine | Admitting: Emergency Medicine

## 2016-02-24 DIAGNOSIS — Y998 Other external cause status: Secondary | ICD-10-CM | POA: Diagnosis not present

## 2016-02-24 DIAGNOSIS — X100XXA Contact with hot drinks, initial encounter: Secondary | ICD-10-CM | POA: Insufficient documentation

## 2016-02-24 DIAGNOSIS — Y9289 Other specified places as the place of occurrence of the external cause: Secondary | ICD-10-CM | POA: Insufficient documentation

## 2016-02-24 DIAGNOSIS — Y9389 Activity, other specified: Secondary | ICD-10-CM | POA: Insufficient documentation

## 2016-02-24 DIAGNOSIS — Z79899 Other long term (current) drug therapy: Secondary | ICD-10-CM | POA: Insufficient documentation

## 2016-02-24 DIAGNOSIS — H9192 Unspecified hearing loss, left ear: Secondary | ICD-10-CM | POA: Insufficient documentation

## 2016-02-24 DIAGNOSIS — T2111XA Burn of first degree of chest wall, initial encounter: Secondary | ICD-10-CM | POA: Diagnosis not present

## 2016-02-24 DIAGNOSIS — Z87891 Personal history of nicotine dependence: Secondary | ICD-10-CM | POA: Insufficient documentation

## 2016-02-24 DIAGNOSIS — T2101XA Burn of unspecified degree of chest wall, initial encounter: Secondary | ICD-10-CM | POA: Diagnosis present

## 2016-02-24 DIAGNOSIS — T300 Burn of unspecified body region, unspecified degree: Secondary | ICD-10-CM
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
## 2016-02-24 NOTE — Discharge Instructions (Signed)
Burn Care °Your skin is a natural barrier to infection. It is the largest organ of your body. Burns damage this natural protection. To help prevent infection, it is very important to follow your caregiver's instructions in the care of your burn. °Burns are classified as: °· First degree. There is only redness of the skin (erythema). No scarring is expected. °· Second degree. There is blistering of the skin. Scarring may occur with deeper burns. °· Third degree. All layers of the skin are injured, and scarring is expected. °HOME CARE INSTRUCTIONS  °· Wash your hands well before changing your bandage. °· Change your bandage as often as directed by your caregiver. °¨ Remove the old bandage. If the bandage sticks, you may soak it off with cool, clean water. °¨ Cleanse the burn thoroughly but gently with mild soap and water. °¨ Pat the area dry with a clean, dry cloth. °¨ Apply a thin layer of antibacterial cream to the burn. °¨ Apply a clean bandage as instructed by your caregiver. °¨ Keep the bandage as clean and dry as possible. °· Elevate the affected area for the first 24 hours, then as instructed by your caregiver. °· Only take over-the-counter or prescription medicines for pain, discomfort, or fever as directed by your caregiver. °SEEK IMMEDIATE MEDICAL CARE IF:  °· You develop excessive pain. °· You develop redness, tenderness, swelling, or red streaks near the burn. °· The burned area develops yellowish-white fluid (pus) or a bad smell. °· You have a fever. °MAKE SURE YOU:  °· Understand these instructions. °· Will watch your condition. °· Will get help right away if you are not doing well or get worse. °  °This information is not intended to replace advice given to you by your health care provider. Make sure you discuss any questions you have with your health care provider. °  °Document Released: 10/04/2005 Document Revised: 12/27/2011 Document Reviewed: 02/24/2011 °Elsevier Interactive Patient Education ©2016  [employer] ° °
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
## 2016-03-01 ENCOUNTER — Ambulatory Visit (HOSPITAL_COMMUNITY)
Admission: EM | Admit: 2016-03-01 | Discharge: 2016-03-01 | Disposition: A | Discharge status: Home / Self Care | Payer: BLUE CROSS/BLUE SHIELD | Attending: Family Medicine | Admitting: Family Medicine

## 2016-03-01 ENCOUNTER — Encounter (HOSPITAL_COMMUNITY): Payer: Self-pay | Admitting: Emergency Medicine

## 2016-03-01 DIAGNOSIS — T2121XA Burn of second degree of chest wall, initial encounter: Secondary | ICD-10-CM
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
## 2016-03-01 NOTE — ED Provider Notes (Signed)
CSN: [id number]     Arrival date & time 03/01/16  1843 History   First MD Initiated Contact with Patient 03/01/16 1953     Chief Complaint  Patient presents with  . Burn  . Wound Check   (Consider location/radiation/quality/duration/timing/severity/associated sxs/prior Treatment) Patient is a 45 y.o. male presenting with burn. The history is provided by the patient.  Burn Burn location:  Torso Torso burn location:  L chest Burn quality:  Painful Time since incident:  1 week Pain details:    Severity:  Mild Mechanism of burn:  Hot liquid   Past Medical History  Diagnosis Date  . Syncope   . Hearing loss in left ear   . Trigeminal neuralgia    Past Surgical History  Procedure Laterality Date  . Appendectomy  1986   Family History  Problem Relation Age of Onset  . Emphysema Maternal Aunt     smoked  . Allergies      "everybody"  . Heart disease Father   . Melanoma Father   . Prostate cancer Father    Social History  Substance Use Topics  . Smoking status: Former Smoker -- 0.75 packs/day for 13 years    Types: Cigarettes    Quit date: 10/25/2014  . Smokeless tobacco: Never Used  . Alcohol Use: 0.0 oz/week    0 Standard drinks or equivalent per week     Comment: occ    Review of Systems  Constitutional: Negative.   Musculoskeletal: Negative.   Skin: Positive for color change and wound.  All other systems reviewed and are negative.   Allergies  Prednisone  Home Medications   Prior to Admission medications   Medication Sig Start Date End Date Taking? Authorizing Provider  cyclobenzaprine (FLEXERIL) 10 MG tablet Take 10 mg by mouth 3 (three) times daily as needed for muscle spasms.    Historical Provider, MD  doxycycline (DORYX) 100 MG EC tablet Take 100 mg by mouth 2 (two) times daily.    Historical Provider, MD  famotidine (PEPCID) 20 MG tablet Take 20 mg by mouth at bedtime.    Historical Provider, MD  ipratropium (ATROVENT) 0.06 % nasal spray Place 2  sprays into both nostrils 4 (four) times daily. 12/21/15   Linna HoffJames D Mclane Arora, MD  pantoprazole (PROTONIX) 40 MG tablet Take 1 tablet (40 mg total) by mouth daily. Patient not taking: Reported on 12/21/2015 11/28/14   Nyoka CowdenMichael B Wert, MD  Pseudoephedrine-APAP-DM (DAYQUIL PO) Take by mouth.    Historical Provider, MD   Meds Ordered and Administered this Visit  Medications - No data to display  BP 110/72 mmHg  Pulse 92  Temp(Src) 98.4 F (36.9 C) (Oral)  Resp 16  SpO2 98% No data found.   Physical Exam  Constitutional: He is oriented to person, place, and time. He appears well-developed and well-nourished.  Neurological: He is alert and oriented to person, place, and time.  Skin: Skin is warm and dry. No rash noted. There is erythema. No pallor.  Erythematous splash burn to left lat chest , dry, no infection.  Nursing note and vitals reviewed.   ED Course  Procedures (including critical care time)  Labs Review Labs Reviewed - No data to display  Imaging Review No results found.   Visual Acuity Review  Right Eye Distance:   Left Eye Distance:   Bilateral Distance:    Right Eye Near:   Left Eye Near:    Bilateral Near:  MDM   1. Second degree burn of chest wall, initial encounter        Linna Hoff, MD 03/01/16 2009

## 2016-03-01 NOTE — Discharge Instructions (Signed)
Wash as much as possible, use vit a oil for itching and dryness., return as needed.

## 2016-07-09 DIAGNOSIS — M6740 Ganglion, unspecified site: Secondary | ICD-10-CM | POA: Diagnosis not present

## 2016-07-09 DIAGNOSIS — M25532 Pain in left wrist: Secondary | ICD-10-CM | POA: Diagnosis not present

## 2016-11-26 DIAGNOSIS — M67432 Ganglion, left wrist: Secondary | ICD-10-CM | POA: Diagnosis not present

## 2016-12-20 DIAGNOSIS — M67432 Ganglion, left wrist: Secondary | ICD-10-CM | POA: Diagnosis not present

## 2016-12-20 DIAGNOSIS — D2112 Benign neoplasm of connective and other soft tissue of left upper limb, including shoulder: Secondary | ICD-10-CM | POA: Diagnosis not present

## 2016-12-27 DIAGNOSIS — M67432 Ganglion, left wrist: Secondary | ICD-10-CM | POA: Diagnosis not present

## 2017-07-11 DIAGNOSIS — M67432 Ganglion, left wrist: Secondary | ICD-10-CM | POA: Diagnosis not present

## 2017-07-13 ENCOUNTER — Other Ambulatory Visit: Payer: Self-pay | Admitting: Orthopedic Surgery

## 2017-07-13 DIAGNOSIS — M67432 Ganglion, left wrist: Secondary | ICD-10-CM

## 2017-07-22 ENCOUNTER — Ambulatory Visit
Admission: RE | Admit: 2017-07-22 | Discharge: 2017-07-22 | Disposition: A | Discharge status: Home / Self Care | Payer: BLUE CROSS/BLUE SHIELD | Source: Ambulatory Visit | Attending: Orthopedic Surgery | Admitting: Orthopedic Surgery

## 2017-07-22 DIAGNOSIS — R2232 Localized swelling, mass and lump, left upper limb: Secondary | ICD-10-CM | POA: Diagnosis not present

## 2017-07-22 DIAGNOSIS — M67432 Ganglion, left wrist: Secondary | ICD-10-CM

## 2017-08-22 ENCOUNTER — Ambulatory Visit: Payer: Self-pay | Admitting: Neurology

## 2017-08-22 ENCOUNTER — Telehealth: Payer: Self-pay | Admitting: Neurology

## 2017-08-22 DIAGNOSIS — G500 Trigeminal neuralgia: Secondary | ICD-10-CM | POA: Diagnosis not present

## 2017-08-22 DIAGNOSIS — R251 Tremor, unspecified: Secondary | ICD-10-CM | POA: Diagnosis not present

## 2017-08-22 NOTE — Telephone Encounter (Signed)
Patient presented today for an apt that was cancelled. I put him in tomorrow morning 11:30 the only open slot this month. After looking at the chart, I see he needed a referral for new symptoms. He was adamant not new symptoms. He said his primary [facility] but will call over and get a referral from them today for tomorrow's apt.  Best call back [phone]

## 2017-08-23 ENCOUNTER — Ambulatory Visit: Payer: BLUE CROSS/BLUE SHIELD | Admitting: Neurology

## 2017-08-23 ENCOUNTER — Encounter: Payer: Self-pay | Admitting: Neurology

## 2017-08-23 VITALS — BP 119/74 | HR 63 | Ht 71.5 in | Wt 179.8 lb

## 2017-08-23 DIAGNOSIS — R569 Unspecified convulsions: Secondary | ICD-10-CM

## 2017-08-23 DIAGNOSIS — G500 Trigeminal neuralgia: Secondary | ICD-10-CM

## 2017-08-23 MED ORDER — OXCARBAZEPINE ER 300 MG PO TB24
300.0000 mg | ORAL_TABLET | Freq: Every day | ORAL | 11 refills | Status: DC
Start: 2017-08-23 — End: 2017-10-07

## 2017-08-23 MED ORDER — OXCARBAZEPINE 150 MG PO TABS: 150.0000 mg | ORAL_TABLET | Freq: Two times a day (BID) | ORAL | 11 refills | Status: DC

## 2017-08-23 NOTE — Progress Notes (Signed)
PATIENT: Steven Reilly DOB: 1971/02/18  Chief Complaint  Patient presents with  . Trigeminal Neuralgia    He was diagnosed with trigeminal neuralgia five years ago.  Reports his pain returning within the last year and being especially aggravated with chewing.  . Shaking Spells    Over the last eight months, he has experienced approximately five shaking spells while sleeping, along with unresponsiveness.  His wife witnessed some of these events that lasted several minutes each time.  Marland Kitchen. PCP    Georgann HousekeeperHusain, Karrar, MD     HISTORICAL  Steven Reilly is a 46 year old male, seen in refer by  his primary care doctor Georgann HousekeeperHusain, Karrar for evaluation of trigeminal neuralgia, and shaking spells, initial evaluation was on August 23 2017.    I saw him previously, most recent was on April 04 2012, he complains of transient electronic shooting pain involving his left jaw at left V3 distribution, consistent with a diagnosis of left trigeminal neuralgia, later also developed intermittent right facial shooting pain, he was treated with carbamazepine 200 mg twice a day which helps his symptoms, later gabapentin was helpful, but he complains of side effect dizziness, difficulty focusing, MRI of the brain was normal then,  Patient reported that he has left lower molar extracted in 2013, which has helped his left facial shooting pain, he was essentially pain-free for about 3 years, until 2016, he began to experience intermittent radiating pain mostly on the right side, starting from right TMJ joints, similar to be related to chewing, and more he chews, the more pain he feels, but there was no right TMJ joints pain, he complains of radiating pain along right lower eyelid, sometimes upper cheek, sometimes lower chin.  He also complains of initially it was intermittent, now almost daily basis, intermittent sharp radiating pain lasting for few minutes, Multiple recurrent episode in the day,  In 2017, he also began to  have intermittent passing out episode, initially it was during early morning time when he jumped out of the bed, during the daylight, transient blackout quickly regained consciousness after he hit the ground.  He also complains of intermittent nighttime seizure like activity, his wife reported that he has uncontrollable body shaking episode last for a few minutes, unresponsive, and he has no recollection of the event, no incontinence, no tongue biting, he had total 5 episodes since January 2018, most recent one in October 2018  REVIEW OF SYSTEMS: Full 14 system review of systems performed and notable only for fatigue, seizure, passing out  ALLERGIES: Allergies  Allergen Reactions  . Prednisone     "makes me uncomfortable"    HOME MEDICATIONS: No current outpatient medications on file.   No current facility-administered medications for this visit.     PAST MEDICAL HISTORY: Past Medical History:  Diagnosis Date  . Hearing loss in left ear   . Lyme disease   . Seizure-like activity (HCC)   . Syncope   . Trigeminal neuralgia     PAST SURGICAL HISTORY: Past Surgical History:  Procedure Laterality Date  . APPENDECTOMY  1986    FAMILY HISTORY: Family History  Problem Relation Age of Onset  . Emphysema Maternal Aunt        smoked  . Allergies Unknown        "everybody"  . Heart disease Father   . Melanoma Father   . Prostate cancer Father     SOCIAL HISTORY:  Social History   Socioeconomic History  . Marital status: Married  Spouse name: Not on file  . Number of children: 2  . Years of education: 33  . Highest education level: Not on file  Social Needs  . Financial resource strain: Not on file  . Food insecurity - worry: Not on file  . Food insecurity - inability: Not on file  . Transportation needs - medical: Not on file  . Transportation needs - non-medical: Not on file  Occupational History  . Occupation: Geophysical data processor   Tobacco Use  . Smoking status:  Former Smoker    Packs/day: 0.75    Years: 13.00    Pack years: 9.75    Types: Cigarettes    Last attempt to quit: 10/25/2014    Years since quitting: 2.8  . Smokeless tobacco: Never Used  Substance and Sexual Activity  . Alcohol use: Yes    Alcohol/week: 0.0 oz    Comment: 3-5 servings per week  . Drug use: No  . Sexual activity: Not on file  Other Topics Concern  . Not on file  Social History Narrative   Lives at home with wife and child.   Right-handed.   2-3 cups caffeine per day.     PHYSICAL EXAM   Vitals:   08/23/17 1141  BP: 119/74  Pulse: 63  Weight: 179 lb 12 oz (81.5 kg)  Height: 5' 11.5" (1.816 m)    Not recorded      Body mass index is 24.72 kg/m.  PHYSICAL EXAMNIATION:  Gen: NAD, conversant, well nourised, obese, well groomed                     Cardiovascular: Regular rate rhythm, no peripheral edema, warm, nontender. Eyes: Conjunctivae clear without exudates or hemorrhage Neck: Supple, no carotid bruits. Pulmonary: Clear to auscultation bilaterally   NEUROLOGICAL EXAM:  MENTAL STATUS: Speech:    Speech is normal; fluent and spontaneous with normal comprehension.  Cognition:     Orientation to time, place and person     Normal recent and remote memory     Normal Attention span and concentration     Normal Language, naming, repeating,spontaneous speech     Fund of knowledge   CRANIAL NERVES: CN II: Visual fields are full to confrontation. Fundoscopic exam is normal with sharp discs and no vascular changes. Pupils are round equal and briskly reactive to light. CN III, IV, VI: extraocular movement are normal. No ptosis. CN V: Facial sensation is intact to pinprick in all 3 divisions bilaterally. Corneal responses are intact.  CN VII: Face is symmetric with normal eye closure and smile. CN VIII: Hearing is normal to rubbing fingers CN IX, X: Palate elevates symmetrically. Phonation is normal. CN XI: Head turning and shoulder shrug are  intact CN XII: Tongue is midline with normal movements and no atrophy.  MOTOR: There is no pronator drift of out-stretched arms. Muscle bulk and tone are normal. Muscle strength is normal.  REFLEXES: Reflexes are 2+ and symmetric at the biceps, triceps, knees, and ankles. Plantar responses are flexor.  SENSORY: Intact to light touch, pinprick, positional sensation and vibratory sensation are intact in fingers and toes.  COORDINATION: Rapid alternating movements and fine finger movements are intact. There is no dysmetria on finger-to-nose and heel-knee-shin.    GAIT/STANCE: Posture is normal. Gait is steady with normal steps, base, arm swing, and turning. Heel and toe walking are normal. Tandem gait is normal.  Romberg is absent.   DIAGNOSTIC DATA (LABS, IMAGING, TESTING) - I reviewed  patient records, labs, notes, testing and imaging myself where available.   ASSESSMENT AND PLAN  Steven Reilly is a 46 y.o. male   Atypical facial pain Seizure-like events  Laboratory evaluations  MRI of the brain with and without contrast  EEG,  Oxtella 300 mg every night   Levert FeinsteinYijun Samyra Limb, M.D. Ph.D.  [facility] [address], [address] [place], [place] [address] Ph: [phone] Fax: [phone]  CC: Georgann HousekeeperHusain, Karrar, MD

## 2017-08-24 ENCOUNTER — Other Ambulatory Visit: Payer: Self-pay | Admitting: Orthopedic Surgery

## 2017-08-30 ENCOUNTER — Ambulatory Visit: Payer: BLUE CROSS/BLUE SHIELD

## 2017-08-30 DIAGNOSIS — G500 Trigeminal neuralgia: Secondary | ICD-10-CM

## 2017-08-30 MED ORDER — GADOPENTETATE DIMEGLUMINE 469.01 MG/ML IV SOLN: 17.0000 mL | Freq: Once | INTRAVENOUS | Status: AC | PRN

## 2017-09-01 ENCOUNTER — Telehealth: Payer: Self-pay | Admitting: *Deleted

## 2017-09-01 NOTE — Telephone Encounter (Signed)
Left patient a detailed message, with results, on voicemail (ok per DPR).  Provided our number to call back with any questions.  

## 2017-09-01 NOTE — Telephone Encounter (Signed)
-----   Message from Levert FeinsteinYijun Yan, MD sent at 09/01/2017  5:00 PM EST ----- Please call pt for normal MRI brain.

## 2017-09-02 ENCOUNTER — Telehealth: Payer: Self-pay

## 2017-09-02 LAB — COMPREHENSIVE METABOLIC PANEL
A/G RATIO: 2.4 — AB (ref 1.2–2.2)
ALT: 31 IU/L (ref 0–44)
AST: 22 IU/L (ref 0–40)
Albumin: 4.4 g/dL (ref 3.5–5.5)
Alkaline Phosphatase: 63 IU/L (ref 39–117)
BUN/Creatinine Ratio: 14 (ref 9–20)
BUN: 16 mg/dL (ref 6–24)
Bilirubin Total: 0.5 mg/dL (ref 0.0–1.2)
CALCIUM: 9.4 mg/dL (ref 8.7–10.2)
CO2: 27 mmol/L (ref 20–29)
CREATININE: 1.18 mg/dL (ref 0.76–1.27)
Chloride: 105 mmol/L (ref 96–106)
GFR calc non Af Amer: 73 mL/min/{1.73_m2} (ref >59)
GFR, EST AFRICAN AMERICAN: 84 mL/min/{1.73_m2} (ref >59)
GLUCOSE: 82 mg/dL (ref 65–99)
Globulin, Total: 1.8 g/dL (ref 1.5–4.5)
Potassium: 5.3 mmol/L — ABNORMAL HIGH (ref 3.5–5.2)
Sodium: 142 mmol/L (ref 134–144)
Total Protein: 6.2 g/dL (ref 6.0–8.5)

## 2017-09-02 LAB — CBC
HEMATOCRIT: 45.5 % (ref 37.5–51.0)
HEMOGLOBIN: 15.2 g/dL (ref 13.0–17.7)
MCH: 29.6 pg (ref 26.6–33.0)
MCHC: 33.4 g/dL (ref 31.5–35.7)
MCV: 89 fL (ref 79–97)
Platelets: 276 10*3/uL (ref 150–379)
RBC: 5.13 x10E6/uL (ref 4.14–5.80)
RDW: 13.6 % (ref 12.3–15.4)
WBC: 6.7 10*3/uL (ref 3.4–10.8)

## 2017-09-02 LAB — VITAMIN B12: Vitamin B-12: 556 pg/mL (ref 232–1245)

## 2017-09-02 LAB — VITAMIN D 1,25 DIHYDROXY
Vitamin D 1, 25 (OH)2 Total: 26 pg/mL
Vitamin D2 1, 25 (OH)2: <10 pg/mL
Vitamin D3 1, 25 (OH)2: 26 pg/mL

## 2017-09-02 LAB — SEDIMENTATION RATE: SED RATE: 2 mm/h (ref 0–15)

## 2017-09-02 LAB — TSH: TSH: 1.68 u[IU]/mL (ref 0.450–4.500)

## 2017-09-02 LAB — ANA W/REFLEX: ANA: NEGATIVE

## 2017-09-02 LAB — C-REACTIVE PROTEIN: CRP: <0.3 mg/L (ref 0.0–4.9)

## 2017-09-02 NOTE — Telephone Encounter (Signed)
I left a detailed message with lab results, ok per dpr. I advised patient to call back with any questions or concerns.   

## 2017-09-02 NOTE — Telephone Encounter (Signed)
-----   Message from Levert FeinsteinYijun Yan, MD sent at 09/02/2017  9:15 AM EST ----- Please call patient for no significant abnormality on laboratory evaluations

## 2017-09-06 ENCOUNTER — Other Ambulatory Visit: Payer: BLUE CROSS/BLUE SHIELD

## 2017-09-21 ENCOUNTER — Encounter (HOSPITAL_BASED_OUTPATIENT_CLINIC_OR_DEPARTMENT_OTHER): Payer: Self-pay | Admitting: *Deleted

## 2017-09-21 ENCOUNTER — Other Ambulatory Visit: Payer: Self-pay

## 2017-09-26 ENCOUNTER — Other Ambulatory Visit: Payer: BLUE CROSS/BLUE SHIELD

## 2017-09-26 ENCOUNTER — Encounter (HOSPITAL_BASED_OUTPATIENT_CLINIC_OR_DEPARTMENT_OTHER): Payer: Self-pay

## 2017-09-27 ENCOUNTER — Ambulatory Visit (HOSPITAL_BASED_OUTPATIENT_CLINIC_OR_DEPARTMENT_OTHER): Payer: BLUE CROSS/BLUE SHIELD | Admitting: Certified Registered"

## 2017-09-27 ENCOUNTER — Other Ambulatory Visit: Payer: Self-pay

## 2017-09-27 ENCOUNTER — Encounter (HOSPITAL_BASED_OUTPATIENT_CLINIC_OR_DEPARTMENT_OTHER)
Admission: RE | Disposition: A | Discharge status: Home / Self Care | Payer: Self-pay | Source: Ambulatory Visit | Attending: Orthopedic Surgery

## 2017-09-27 ENCOUNTER — Ambulatory Visit (HOSPITAL_BASED_OUTPATIENT_CLINIC_OR_DEPARTMENT_OTHER)
Admission: RE | Admit: 2017-09-27 | Discharge: 2017-09-27 | Disposition: A | Discharge status: Home / Self Care | Payer: BLUE CROSS/BLUE SHIELD | Source: Ambulatory Visit | Attending: Orthopedic Surgery | Admitting: Orthopedic Surgery

## 2017-09-27 ENCOUNTER — Encounter (HOSPITAL_BASED_OUTPATIENT_CLINIC_OR_DEPARTMENT_OTHER): Payer: Self-pay | Admitting: *Deleted

## 2017-09-27 DIAGNOSIS — Z888 Allergy status to other drugs, medicaments and biological substances status: Secondary | ICD-10-CM | POA: Insufficient documentation

## 2017-09-27 DIAGNOSIS — Z79899 Other long term (current) drug therapy: Secondary | ICD-10-CM | POA: Diagnosis not present

## 2017-09-27 DIAGNOSIS — Z87891 Personal history of nicotine dependence: Secondary | ICD-10-CM | POA: Diagnosis not present

## 2017-09-27 DIAGNOSIS — H9192 Unspecified hearing loss, left ear: Secondary | ICD-10-CM | POA: Insufficient documentation

## 2017-09-27 DIAGNOSIS — Z808 Family history of malignant neoplasm of other organs or systems: Secondary | ICD-10-CM | POA: Diagnosis not present

## 2017-09-27 DIAGNOSIS — Z825 Family history of asthma and other chronic lower respiratory diseases: Secondary | ICD-10-CM | POA: Insufficient documentation

## 2017-09-27 DIAGNOSIS — Z8249 Family history of ischemic heart disease and other diseases of the circulatory system: Secondary | ICD-10-CM | POA: Insufficient documentation

## 2017-09-27 DIAGNOSIS — R0600 Dyspnea, unspecified: Secondary | ICD-10-CM | POA: Diagnosis not present

## 2017-09-27 DIAGNOSIS — G500 Trigeminal neuralgia: Secondary | ICD-10-CM | POA: Diagnosis not present

## 2017-09-27 DIAGNOSIS — M67432 Ganglion, left wrist: Secondary | ICD-10-CM | POA: Diagnosis not present

## 2017-09-27 DIAGNOSIS — G709 Myoneural disorder, unspecified: Secondary | ICD-10-CM | POA: Insufficient documentation

## 2017-09-27 DIAGNOSIS — Z8042 Family history of malignant neoplasm of prostate: Secondary | ICD-10-CM | POA: Diagnosis not present

## 2017-09-27 HISTORY — PX: MASS EXCISION: SHX2000

## 2017-09-27 SURGERY — EXCISION MASS
Anesthesia: General | Site: Wrist | Laterality: Left

## 2017-09-27 MED ORDER — BUPIVACAINE HCL (PF) 0.25 % IJ SOLN
INTRAMUSCULAR | Status: AC
  Filled 2017-09-27: qty 30

## 2017-09-27 MED ORDER — LACTATED RINGERS IV SOLN
INTRAVENOUS | Status: DC
  Administered 2017-09-27: 12:00:00 via INTRAVENOUS

## 2017-09-27 MED ORDER — FENTANYL CITRATE (PF) 100 MCG/2ML IJ SOLN
INTRAMUSCULAR | Status: AC
  Filled 2017-09-27: qty 2

## 2017-09-27 MED ORDER — KETOROLAC TROMETHAMINE 30 MG/ML IJ SOLN
INTRAMUSCULAR | Status: DC | PRN
  Administered 2017-09-27: 30 mg via INTRAVENOUS

## 2017-09-27 MED ORDER — ONDANSETRON HCL 4 MG/2ML IJ SOLN
INTRAMUSCULAR | Status: DC | PRN
  Administered 2017-09-27: 4 mg via INTRAVENOUS

## 2017-09-27 MED ORDER — DEXAMETHASONE SODIUM PHOSPHATE 10 MG/ML IJ SOLN
INTRAMUSCULAR | Status: DC | PRN
  Administered 2017-09-27: 10 mg via INTRAVENOUS

## 2017-09-27 MED ORDER — PROPOFOL 10 MG/ML IV BOLUS
INTRAVENOUS | Status: DC | PRN
  Administered 2017-09-27: 200 mg via INTRAVENOUS

## 2017-09-27 MED ORDER — FENTANYL CITRATE (PF) 100 MCG/2ML IJ SOLN
50.0000 ug | INTRAMUSCULAR | Status: AC | PRN
  Administered 2017-09-27: 25 ug via INTRAVENOUS
  Administered 2017-09-27: 100 ug via INTRAVENOUS
  Administered 2017-09-27: 50 ug via INTRAVENOUS

## 2017-09-27 MED ORDER — LIDOCAINE HCL (CARDIAC) 20 MG/ML IV SOLN
INTRAVENOUS | Status: DC | PRN
  Administered 2017-09-27: 80 mg via INTRAVENOUS

## 2017-09-27 MED ORDER — 0.9 % SODIUM CHLORIDE (POUR BTL) OPTIME
TOPICAL | Status: DC | PRN
  Administered 2017-09-27: 120 mL

## 2017-09-27 MED ORDER — CEFAZOLIN SODIUM-DEXTROSE 2-4 GM/100ML-% IV SOLN: 2.0000 g | INTRAVENOUS | Status: DC

## 2017-09-27 MED ORDER — PROPOFOL 10 MG/ML IV BOLUS
INTRAVENOUS | Status: AC
  Filled 2017-09-27: qty 20

## 2017-09-27 MED ORDER — MIDAZOLAM HCL 2 MG/2ML IJ SOLN
INTRAMUSCULAR | Status: AC
  Filled 2017-09-27: qty 2

## 2017-09-27 MED ORDER — PROPOFOL 500 MG/50ML IV EMUL
INTRAVENOUS | Status: AC
  Filled 2017-09-27: qty 50

## 2017-09-27 MED ORDER — SCOPOLAMINE 1 MG/3DAYS TD PT72: 1.0000 | MEDICATED_PATCH | Freq: Once | TRANSDERMAL | Status: DC | PRN

## 2017-09-27 MED ORDER — CEFAZOLIN SODIUM-DEXTROSE 2-4 GM/100ML-% IV SOLN
INTRAVENOUS | Status: AC
  Filled 2017-09-27: qty 100

## 2017-09-27 MED ORDER — OXYCODONE-ACETAMINOPHEN 5-325 MG PO TABS: ORAL_TABLET | ORAL | 0 refills | Status: DC

## 2017-09-27 MED ORDER — CEFAZOLIN SODIUM-DEXTROSE 2-3 GM-%(50ML) IV SOLR
INTRAVENOUS | Status: DC | PRN
  Administered 2017-09-27: 2 g via INTRAVENOUS

## 2017-09-27 MED ORDER — CHLORHEXIDINE GLUCONATE 4 % EX LIQD: 60.0000 mL | Freq: Once | CUTANEOUS | Status: DC

## 2017-09-27 MED ORDER — MIDAZOLAM HCL 2 MG/2ML IJ SOLN
1.0000 mg | INTRAMUSCULAR | Status: DC | PRN
  Administered 2017-09-27: 2 mg via INTRAVENOUS

## 2017-09-27 MED ORDER — BUPIVACAINE HCL (PF) 0.25 % IJ SOLN
INTRAMUSCULAR | Status: DC | PRN
  Administered 2017-09-27: 8 mL

## 2017-09-27 MED ORDER — LACTATED RINGERS IV SOLN
500.0000 mL | INTRAVENOUS | Status: DC
  Administered 2017-09-27: 500 mL via INTRAVENOUS

## 2017-09-27 MED ORDER — ONDANSETRON HCL 4 MG/2ML IJ SOLN: INTRAMUSCULAR | Status: DC | PRN

## 2017-09-27 SURGICAL SUPPLY — 58 items
BANDAGE ACE 3X5.8 VEL STRL LF (GAUZE/BANDAGES/DRESSINGS) ×3 IMPLANT
BANDAGE COBAN STERILE 2 (GAUZE/BANDAGES/DRESSINGS) IMPLANT
BENZOIN TINCTURE PRP APPL 2/3 (GAUZE/BANDAGES/DRESSINGS) IMPLANT
BLADE MINI RND TIP GREEN BEAV (BLADE) ×3 IMPLANT
BLADE SURG 15 STRL LF DISP TIS (BLADE) ×2 IMPLANT
BLADE SURG 15 STRL SS (BLADE) ×4
BNDG COHESIVE 1X5 TAN STRL LF (GAUZE/BANDAGES/DRESSINGS) IMPLANT
BNDG CONFORM 2 STRL LF (GAUZE/BANDAGES/DRESSINGS) IMPLANT
BNDG ELASTIC 2X5.8 VLCR STR LF (GAUZE/BANDAGES/DRESSINGS) IMPLANT
BNDG ESMARK 4X9 LF (GAUZE/BANDAGES/DRESSINGS) ×3 IMPLANT
BNDG GAUZE 1X2.1 STRL (MISCELLANEOUS) IMPLANT
BNDG GAUZE ELAST 4 BULKY (GAUZE/BANDAGES/DRESSINGS) ×3 IMPLANT
BNDG PLASTER X FAST 3X3 WHT LF (CAST SUPPLIES) ×30 IMPLANT
CHLORAPREP W/TINT 26ML (MISCELLANEOUS) ×3 IMPLANT
CLOSURE WOUND 1/2 X4 (GAUZE/BANDAGES/DRESSINGS)
CORD BIPOLAR FORCEPS 12FT (ELECTRODE) ×3 IMPLANT
COVER BACK TABLE 60X90IN (DRAPES) ×3 IMPLANT
COVER MAYO STAND STRL (DRAPES) ×3 IMPLANT
CUFF TOURNIQUET SINGLE 18IN (TOURNIQUET CUFF) ×3 IMPLANT
DRAPE EXTREMITY T 121X128X90 (DRAPE) ×3 IMPLANT
DRAPE SURG 17X23 STRL (DRAPES) ×3 IMPLANT
GAUZE SPONGE 4X4 12PLY STRL (GAUZE/BANDAGES/DRESSINGS) ×3 IMPLANT
GAUZE XEROFORM 1X8 LF (GAUZE/BANDAGES/DRESSINGS) ×3 IMPLANT
GLOVE BIO SURGEON STRL SZ7.5 (GLOVE) ×3 IMPLANT
GLOVE BIOGEL PI IND STRL 7.0 (GLOVE) ×1 IMPLANT
GLOVE BIOGEL PI IND STRL 7.5 (GLOVE) ×1 IMPLANT
GLOVE BIOGEL PI IND STRL 8 (GLOVE) ×1 IMPLANT
GLOVE BIOGEL PI IND STRL 8.5 (GLOVE) ×1 IMPLANT
GLOVE BIOGEL PI INDICATOR 7.0 (GLOVE) ×2
GLOVE BIOGEL PI INDICATOR 7.5 (GLOVE) ×2
GLOVE BIOGEL PI INDICATOR 8 (GLOVE) ×2
GLOVE BIOGEL PI INDICATOR 8.5 (GLOVE) ×2
GLOVE SURG ORTHO 8.0 STRL STRW (GLOVE) ×3 IMPLANT
GLOVE SURG SYN 7.5  E (GLOVE) ×2
GLOVE SURG SYN 7.5 E (GLOVE) ×1 IMPLANT
GOWN STRL REUS W/ TWL LRG LVL3 (GOWN DISPOSABLE) ×1 IMPLANT
GOWN STRL REUS W/TWL LRG LVL3 (GOWN DISPOSABLE) ×2
GOWN STRL REUS W/TWL XL LVL3 (GOWN DISPOSABLE) ×6 IMPLANT
NEEDLE HYPO 25X1 1.5 SAFETY (NEEDLE) ×3 IMPLANT
NS IRRIG 1000ML POUR BTL (IV SOLUTION) ×3 IMPLANT
PACK BASIN DAY SURGERY FS (CUSTOM PROCEDURE TRAY) ×3 IMPLANT
PAD CAST 3X4 CTTN HI CHSV (CAST SUPPLIES) IMPLANT
PAD CAST 4YDX4 CTTN HI CHSV (CAST SUPPLIES) ×1 IMPLANT
PADDING CAST ABS 4INX4YD NS (CAST SUPPLIES) ×2
PADDING CAST ABS COTTON 4X4 ST (CAST SUPPLIES) ×1 IMPLANT
PADDING CAST COTTON 3X4 STRL (CAST SUPPLIES)
PADDING CAST COTTON 4X4 STRL (CAST SUPPLIES) ×2
STOCKINETTE 4X48 STRL (DRAPES) ×3 IMPLANT
STRIP CLOSURE SKIN 1/2X4 (GAUZE/BANDAGES/DRESSINGS) IMPLANT
SUT ETHILON 3 0 PS 1 (SUTURE) ×3 IMPLANT
SUT ETHILON 4 0 PS 2 18 (SUTURE) ×3 IMPLANT
SUT ETHILON 5 0 P 3 18 (SUTURE)
SUT NYLON ETHILON 5-0 P-3 1X18 (SUTURE) IMPLANT
SUT VIC AB 4-0 P2 18 (SUTURE) IMPLANT
SYR BULB 3OZ (MISCELLANEOUS) ×3 IMPLANT
SYR CONTROL 10ML LL (SYRINGE) ×3 IMPLANT
TOWEL OR 17X24 6PK STRL BLUE (TOWEL DISPOSABLE) ×6 IMPLANT
UNDERPAD 30X30 (UNDERPADS AND DIAPERS) ×3 IMPLANT

## 2017-09-27 NOTE — Anesthesia Preprocedure Evaluation (Addendum)
Anesthesia Evaluation  Patient identified by MRN, date of birth, ID band Patient awake    Reviewed: Allergy & Precautions, NPO status , Patient's Chart, lab work & pertinent test results  Airway Mallampati: I  TM Distance: >3 FB Neck ROM: Full    Dental  (+) Teeth Intact, Dental Advisory Given   Pulmonary former smoker,    breath sounds clear to auscultation       Cardiovascular negative cardio ROS   Rhythm:Regular Rate:Normal     Neuro/Psych  Neuromuscular disease    GI/Hepatic negative GI ROS, Neg liver ROS,   Endo/Other  negative endocrine ROS  Renal/GU negative Renal ROS     Musculoskeletal negative musculoskeletal ROS (+)   Abdominal Normal abdominal exam  (+)   Peds  Hematology negative hematology ROS (+)   Anesthesia Other Findings   Reproductive/Obstetrics                            Anesthesia Physical Anesthesia Plan  ASA: II  Anesthesia Plan: General   Post-op Pain Management:    Induction: Intravenous  PONV Risk Score and Plan: 3 and Ondansetron, Dexamethasone and Midazolam  Airway Management Planned: LMA  Additional Equipment:   Intra-op Plan:   Post-operative Plan: Extubation in OR  Informed Consent: I have reviewed the patients History and Physical, chart, labs and discussed the procedure including the risks, benefits and alternatives for the proposed anesthesia with the patient or authorized representative who has indicated his/her understanding and acceptance.   Dental advisory given  Plan Discussed with: CRNA  Anesthesia Plan Comments:         Anesthesia Quick Evaluation

## 2017-09-27 NOTE — Transfer of Care (Signed)
Immediate Anesthesia Transfer of Care Note  Patient: Steven HeadingMichael Reilly  Procedure(s) Performed: EXCISION LEFT WRIST RECURRENT VOLAR GANGLION CYST (Left Wrist)  Patient Location: PACU  Anesthesia Type:General  Level of Consciousness: sedated  Airway & Oxygen Therapy: Patient Spontanous Breathing and Patient connected to face mask oxygen  Post-op Assessment: Report given to RN and Post -op Vital signs reviewed and stable  Post vital signs: Reviewed and stable  Last Vitals:  Vitals:   09/27/17 1110  BP: 115/72  Pulse: 77  Resp: 16  Temp: 36.4 C  SpO2: 100%    Last Pain:  Vitals:   09/27/17 1110  TempSrc: Oral         Complications: No apparent anesthesia complications

## 2017-09-27 NOTE — Brief Op Note (Signed)
09/27/2017  12:58 PM  PATIENT:  Arma HeadingMichael Kresse  46 y.o. male  PRE-OPERATIVE DIAGNOSIS:  LEFT WRIST RECURRENT VOLAR GANGLION  POST-OPERATIVE DIAGNOSIS:  LEFT WRIST RECURRENT VOLAR GANGLION  PROCEDURE:  Procedure(s): EXCISION LEFT WRIST RECURRENT VOLAR GANGLION CYST (Left)  SURGEON:  Surgeon(s) and Role:    * Betha LoaKuzma, Zamia Tyminski, MD - Primary    * Cindee SaltKuzma, Gary, MD - Assisting  PHYSICIAN ASSISTANT:   ASSISTANTS: Cindee SaltGary Rivan Siordia, MD   ANESTHESIA:   general  EBL:  3 mL   BLOOD ADMINISTERED:none  DRAINS: none   LOCAL MEDICATIONS USED:  MARCAINE     SPECIMEN:  Source of Specimen:  left volar wrist  DISPOSITION OF SPECIMEN:  PATHOLOGY  COUNTS:  YES  TOURNIQUET:   Total Tourniquet Time Documented: Upper Arm (Left) - 24 minutes Total: Upper Arm (Left) - 24 minutes   DICTATION: .Other Dictation: Dictation Number [id number]  PLAN OF CARE: Discharge to home after PACU  PATIENT DISPOSITION:  PACU - hemodynamically stable.

## 2017-09-27 NOTE — Anesthesia Procedure Notes (Signed)
Procedure Name: LMA Insertion Performed by: Karen KitchensKelly, Jovonta Levit M, CRNA Pre-anesthesia Checklist: Patient identified, Emergency Drugs available, Suction available, Patient being monitored and Timeout performed Preoxygenation: Pre-oxygenation with 100% oxygen Induction Type: IV induction Ventilation: Mask ventilation without difficulty LMA: LMA inserted LMA Size: 4.0 Tube type: Oral Number of attempts: 1 Placement Confirmation: CO2 detector,  positive ETCO2 and breath sounds checked- equal and bilateral Tube secured with: Tape Dental Injury: Teeth and Oropharynx as per pre-operative assessment

## 2017-09-27 NOTE — Op Note (Signed)
NAMArma Heading:  Steven Reilly, Steven Reilly              ACCOUNT NO.:  [id number]  MEDICAL RECORD NO.:  [id number]  LOCATION:                                 FACILITY:  PHYSICIAN:  Steven LoaKevin Wladyslaw Henrichs, MD             DATE OF BIRTH:  DATE OF PROCEDURE:  09/27/2017 DATE OF DISCHARGE:                              OPERATIVE REPORT   PREOPERATIVE DIAGNOSIS:  Left wrist recurrent volar ganglion cyst.  POSTOPERATIVE DIAGNOSIS:  Left wrist recurrent volar ganglion cyst.  PROCEDURE:  Excision of left wrist recurrent volar ganglion cyst.  SURGEON:  Steven LoaKevin Elaine Middleton, MD.  ASSISTANT:  Steven SaltGary Catherene Kaleta, MD.  ANESTHESIA:  General.  IV FLUIDS:  Per anesthesia flow sheet.  ESTIMATED BLOOD LOSS:  Minimal.  COMPLICATIONS:  None.  SPECIMENS:  Left wrist ganglion to Pathology.  TOURNIQUET TIME:  24 minutes.  DISPOSITION:  Stable to PACU.  INDICATIONS:  Steven Reilly is a 46 year old male, who previously underwent excision of left wrist volar ganglion cyst.  This has recurred.  It is bothersome to him and he wishes to have it excised again.  Risks, benefits, and alternatives of surgery were discussed including the risk of blood loss; infection; damage to nerves, vessels, tendons, ligaments, bone; failure of surgery; need for additional surgery; complications with wound healing; continued pain, and recurrence of cyst.  He voiced understanding of these risks, elected to proceed.  OPERATIVE COURSE:  After being identified preoperatively by myself, the patient and I agreed upon procedure and site of procedure.  Surgical site was marked.  Risks, benefits, and alternatives of surgery were reviewed and he wished to proceed.  Surgical consent had been signed. He was given IV Ancef as preoperative antibiotic prophylaxis. Transferred to the operating room and placed on the operating room table in supine position with left upper extremity on arm board.  General anesthesia was induced by anesthesiologist.  Left upper extremity was prepped  and draped in normal sterile orthopedic fashion.  Surgical pause was performed between surgeons, Anesthesia, and operating room staff; and all were in agreement as to the patient, procedure, and site of procedure.  Tourniquet at the proximal aspect of the extremity was inflated to 250 mmHg after exsanguination of the limb with an Esmarch bandage.  Previous incision was followed and extended proximally to aid in visualization.  Recurrence of ganglion was noted at the radial side of the wound.  This was radial to the FCR tendon.  This carefully freed up surrounding tissues.  The radial artery was identified and protected throughout the case.  There was superficial branch of radial nerve, which was stuck to the cyst and carefully freed up and protected.  The cyst was removed and sent to Pathology for examination.  There was additional cyst noted within the scar more ulnarly in the wound.  This was over the FCR tendon sheath.  This was removed.  It appeared to be coming from the FCR tendon sheath.  The sheath, itself, was split distally to the level of the trapezial ridge.  The tendon was retracted and the area visualized.  I did not see any rent in the capsule.  The area of the  cyst was debrided.  The wound was copiously irrigated with sterile saline and closed with 4-0 nylon in a horizontal mattress fashion.  It was injected with 8 mL of 0.25% plain Marcaine to aid in postoperative analgesia.  It was then dressed with sterile Xeroform, 4x4s, and wrapped with a Kerlix bandage.  A volar splint was placed and wrapped with Kerlix and Ace bandage.  Tourniquet was deflated at 24 minutes.  Fingertips were pink with brisk capillary refill after deflation of tourniquet.  Operative drapes were broken down.  The patient was awoken from anesthesia safely.  He was transferred back to stretcher and taken to PACU in stable condition.  I will see him back in the office in 1 week for postoperative followup.   We will give him Percocet 5/325 one to two p.o. q.6 hours p.r.n. pain, dispensed #20.     Steven LoaKevin Alyza Artiaga, MD     KK/MEDQ  D:  09/27/2017  T:  09/27/2017  Job:  [id number]

## 2017-09-27 NOTE — H&P (Signed)
[name] is an 46 y.o. male.   Chief Complaint: left wrist recurrent volar ganglion HPI: 46 yo male with recurrent left wrist volar ganglion.  He wishes to have this removed again.  Allergies:  Allergies  Allergen Reactions  . Prednisone     "makes me uncomfortable"    Past Medical History:  Diagnosis Date  . Hearing loss in left ear   . Lyme disease   . Seizure-like activity (HCC)   . Syncope   . Trigeminal neuralgia     Past Surgical History:  Procedure Laterality Date  . APPENDECTOMY  1986  . GANGLION CYST EXCISION Left 12/2015    Family History: Family History  Problem Relation Age of Onset  . Emphysema Maternal Aunt        smoked  . Allergies Unknown        "everybody"  . Heart disease Father   . Melanoma Father   . Prostate cancer Father     Social History:   reports that he quit smoking about 2 years ago. His smoking use included cigarettes. He has a 9.75 pack-year smoking history. he has never used smokeless tobacco. He reports that he drinks alcohol. He reports that he does not use drugs.  Medications: Medications Prior to Admission  Medication Sig Dispense Refill  . OXcarbazepine ER (OXTELLAR XR) 300 MG TB24 Take 300 mg at bedtime by mouth. 30 tablet 11    No results found for this or any previous visit (from the past 48 hour(s)).  No results found.   A comprehensive review of systems was negative.  Blood pressure 115/72, pulse 77, temperature 97.6 F (36.4 C), temperature source Oral, resp. rate 16, height 5\' 11"  (1.803 m), weight 81.6 kg (179 lb 12.8 oz), SpO2 100 %.  General appearance: alert, cooperative and appears stated age Head: Normocephalic, without obvious abnormality, atraumatic Neck: supple, symmetrical, trachea midline Resp: clear to auscultation bilaterally Cardio: regular rate and rhythm GI: non-tender Extremities: Intact sensation and capillary refill all digits.  +epl/fpl/io.  No wounds.  Pulses: 2+ and symmetric Skin:  Skin color, texture, turgor normal. No rashes or lesions Neurologic: Grossly normal Incision/Wound:none  Assessment/Plan Left wrist recurrent volar ganglion.  Non operative and operative treatment options were discussed with the patient and patient wishes to proceed with operative treatment. Risks, benefits, and alternatives of surgery were discussed and the patient agrees with the plan of care.   Steven Reilly R 09/27/2017, 12:08 PM

## 2017-09-27 NOTE — Anesthesia Postprocedure Evaluation (Signed)
Anesthesia Post Note  Patient: Arma HeadingMichael Honeyman  Procedure(s) Performed: EXCISION LEFT WRIST RECURRENT VOLAR GANGLION CYST (Left Wrist)     Patient location during evaluation: PACU Anesthesia Type: General Level of consciousness: awake and alert Pain management: pain level controlled Vital Signs Assessment: post-procedure vital signs reviewed and stable Respiratory status: spontaneous breathing, nonlabored ventilation, respiratory function stable and patient connected to nasal cannula oxygen Cardiovascular status: blood pressure returned to baseline and stable Postop Assessment: no apparent nausea or vomiting Anesthetic complications: no    Last Vitals:  Vitals:   09/27/17 1308 09/27/17 1350  BP:  104/73  Pulse: 77 64  Resp: 12 18  Temp:  36.6 C  SpO2: 100% 100%    Last Pain:  Vitals:   09/27/17 1350  TempSrc:   PainSc: 0-No pain                 Shelton SilvasKevin D Treyden Hakim

## 2017-09-27 NOTE — Op Note (Signed)
I assisted Surgeon(s) and Role:    * Betha LoaKuzma, Kevin, MD - Primary    * Cindee SaltKuzma, Ronnell Makarewicz, MD - Assisting on the Procedure(s): EXCISION LEFT WRIST RECURRENT VOLAR GANGLION CYST on 09/27/2017.  I provided assistance on this case as follows: setup approach, identification of the cyst, removal, closure and application of the dressings and splint. I was present for the entire case.  Electronically signed by: Nicki ReaperKUZMA,Charlsey Moragne R, MD Date: 09/27/2017 Time: 12:59 PM

## 2017-09-27 NOTE — Discharge Instructions (Addendum)

## 2017-09-27 NOTE — Op Note (Signed)
[id number] 

## 2017-09-28 ENCOUNTER — Encounter (HOSPITAL_BASED_OUTPATIENT_CLINIC_OR_DEPARTMENT_OTHER): Payer: Self-pay | Admitting: Orthopedic Surgery

## 2017-09-28 NOTE — Addendum Note (Signed)
Addendum  created 09/28/17 [id number] by Lance CoonWebster, [place], CRNA   Charge Capture section accepted

## 2017-10-06 ENCOUNTER — Ambulatory Visit: Payer: BLUE CROSS/BLUE SHIELD | Admitting: Neurology

## 2017-10-06 DIAGNOSIS — G500 Trigeminal neuralgia: Secondary | ICD-10-CM

## 2017-10-06 DIAGNOSIS — R569 Unspecified convulsions: Secondary | ICD-10-CM

## 2017-10-06 NOTE — Procedures (Signed)
   HISTORY: 46 year old male with shaking spells  TECHNIQUE:  16 channel EEG was performed based on standard 10-16 international system. One channel was dedicated to EKG, which has demonstrates normal sinus rhythm of 72 beats per minutes.  Upon awakening, the posterior background activity was well-developed, in alpha range, 9 Hz, reactive to eye opening and closure.  There was no evidence of epileptiform discharge.  Photic stimulation was performed, which induced a symmetric photic driving.  Hyperventilation was performed, there was no abnormality elicit.  No sleep was achieved.  CONCLUSION: This is a  normal awake EEG.  There is no electrodiagnostic evidence of epileptiform discharge.  Levert FeinsteinYijun Joe Tanney, M.D. Ph.D.  [facility] [address] [place], [place] [address] Phone: [phone] Fax:      [phone]

## 2017-10-07 ENCOUNTER — Telehealth: Payer: Self-pay | Admitting: Neurology

## 2017-10-07 MED ORDER — OXCARBAZEPINE ER 300 MG PO TB24: 600.0000 mg | ORAL_TABLET | Freq: Every day | ORAL | 11 refills | Status: DC

## 2017-10-07 NOTE — Telephone Encounter (Signed)
Called and spoke with pt. Relayed Dr. Zannie CoveYan's message. He stated he started off taking oxtellar 300mg  qhs. It helped for about 2 weeks and then became ineffective. He decided to start taking at 12pm instead and this helped for awhile. For the last week, it has been ineffective in helping facial pain. Yesterday he took a dose at 12pm and 630pm and noticed it helped relieve his sx. He is wondering if he can take 30mg  in the am and 300mg  in the pm since he noticed this was beneficial. I placed pt on hold, spoke with Dr. Terrace ArabiaYan. Got verbal that it was okay for patient to take medication 1 in the am and 1 in the pm. I relayed this to pt and advised rx called into his pharmacy. Directions read 600mg  qhs, but he can split dose to be BID. He verbalized understanding and appreciation for call.

## 2017-10-07 NOTE — Telephone Encounter (Signed)
Pt request EEG results, he was advised yesterday to call back today before 11am.  Pt said he is still in pain and is wanting OXcarbazepine ER (OXTELLAR XR) 300 MG TB24 increased. Please call to advise Pt is aware the clinic closes at noon today and will reopen Wednesday of next week

## 2017-10-07 NOTE — Telephone Encounter (Signed)
Pt returned Dr Zannie CoveYan's call. Please call to advise  Thank you

## 2017-10-07 NOTE — Telephone Encounter (Signed)
I called patient,  left a message for normal EEG,  I also called in a new prescription Oxtellar 300 mg 2 tablets every night,, in the message, I said 450mg  every night, just realized that there is no 450mg  preparation,  It is Ok to take 600mg  qhs if he did not have significant side effect from [employer].

## 2017-10-26 MED ORDER — OXCARBAZEPINE ER 300 MG PO TB24: 600.0000 mg | ORAL_TABLET | Freq: Two times a day (BID) | ORAL | 11 refills | Status: DC

## 2017-10-26 NOTE — Telephone Encounter (Signed)
Pt called he has increased medication to 300mg  every 4 hrs for the past couple of weeks and it is helping "tremendously". Pt said he will be out of medication and needs refill sent to [facility]. Please call to advise him at [phone]

## 2017-10-26 NOTE — Addendum Note (Signed)
Addended by: Lindell SparKIRKMAN, MICHELLE C on: 10/26/2017 11:49 AM   Modules accepted: Orders

## 2017-10-26 NOTE — Telephone Encounter (Signed)
Spoke to patient - states he has been taking 4 doses of Oxtellar XR 300mg  daily.  The increase in dosage has been very helpful in controlling his symptoms.  Dr. Terrace ArabiaYan has reviewed his chart.  Per vo by Dr. Terrace ArabiaYan, prescription provided for Oxtellar XR 300mg , 2 tablets BID.  She would like for him to try this schedule and if not helpful he may space back out to 4 times daily.  He is agreeable to this plan.  New rx has been sent to the pharmacy.

## 2017-11-15 ENCOUNTER — Ambulatory Visit: Payer: BLUE CROSS/BLUE SHIELD | Admitting: Neurology

## 2017-11-17 DIAGNOSIS — Z23 Encounter for immunization: Secondary | ICD-10-CM | POA: Diagnosis not present

## 2017-11-17 DIAGNOSIS — Z1389 Encounter for screening for other disorder: Secondary | ICD-10-CM | POA: Diagnosis not present

## 2017-11-17 DIAGNOSIS — Z0000 Encounter for general adult medical examination without abnormal findings: Secondary | ICD-10-CM | POA: Diagnosis not present

## 2018-04-18 IMAGING — US US EXTREM UP*L* LTD
1 series · 14 of 17 positions shown · non-contrast
Comparison: None

CLINICAL DATA: Left wrist mass.

EXAM:
ULTRASOUND LEFT UPPER EXTREMITY LIMITED
TECHNIQUE: Ultrasound examination of the upper extremity soft tissues was
performed in the area of clinical concern.

[Series 1: us extrem up*left* ltd · 0.03mm/px · 17 acquisitions, 14 frames shown]
[im 1/17]
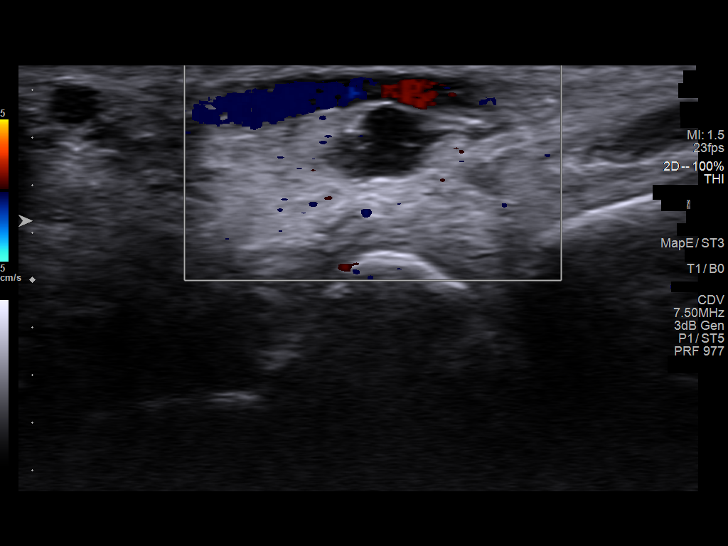
[im 2/17]
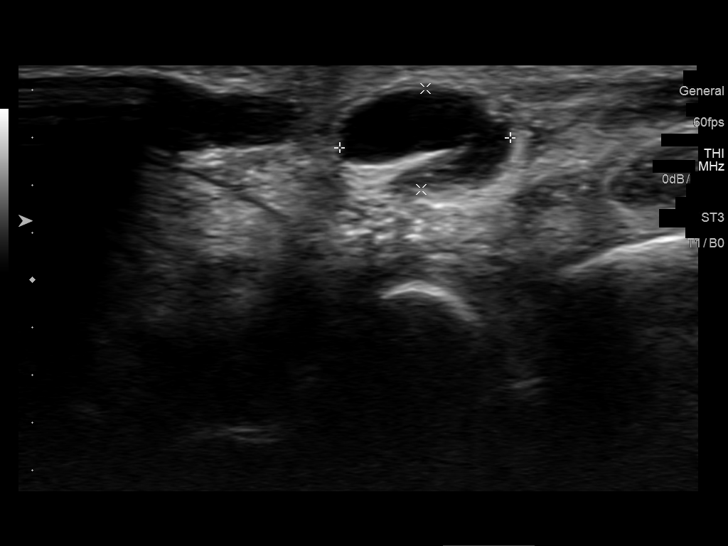
[im 4/17]
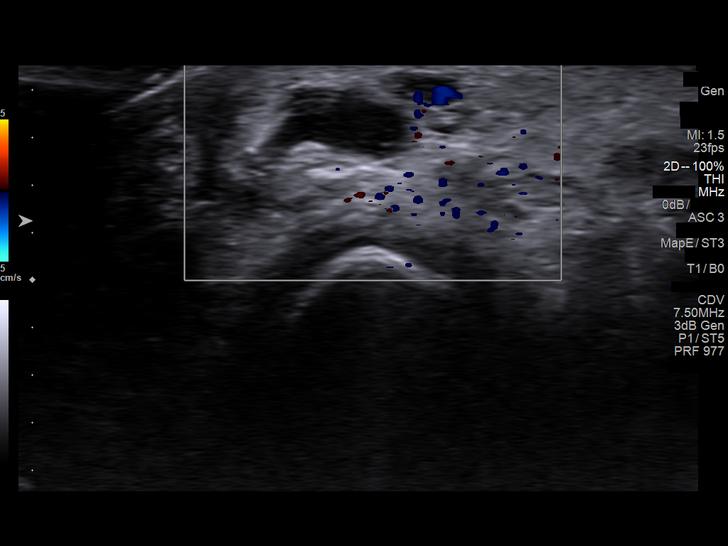
[im 5/17]
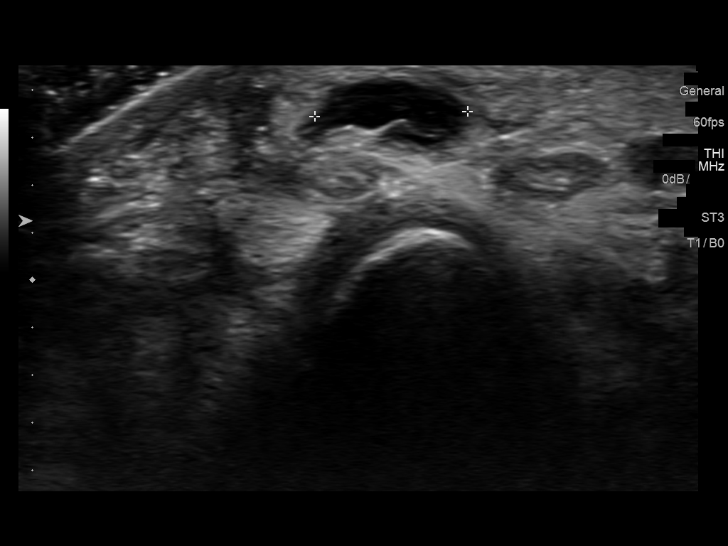
[im 6/17]
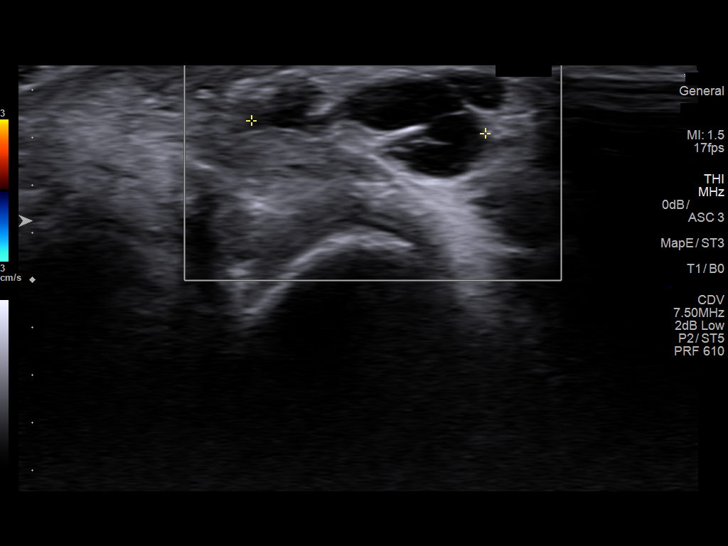
[im 7/17]
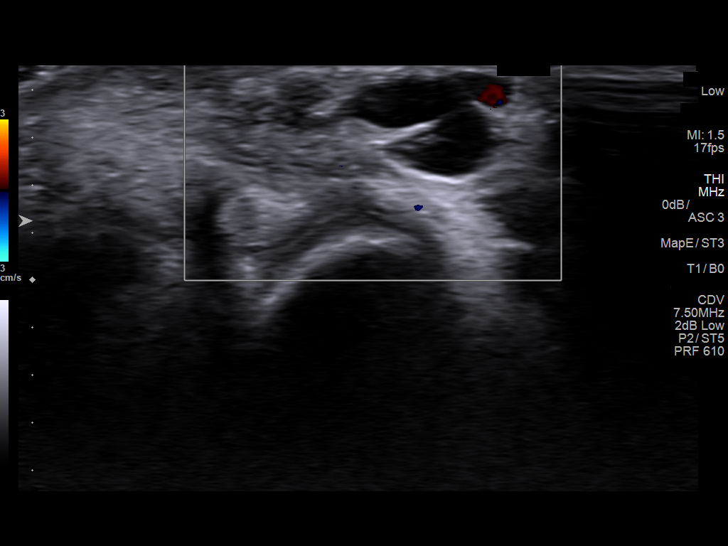
[im 8/17]
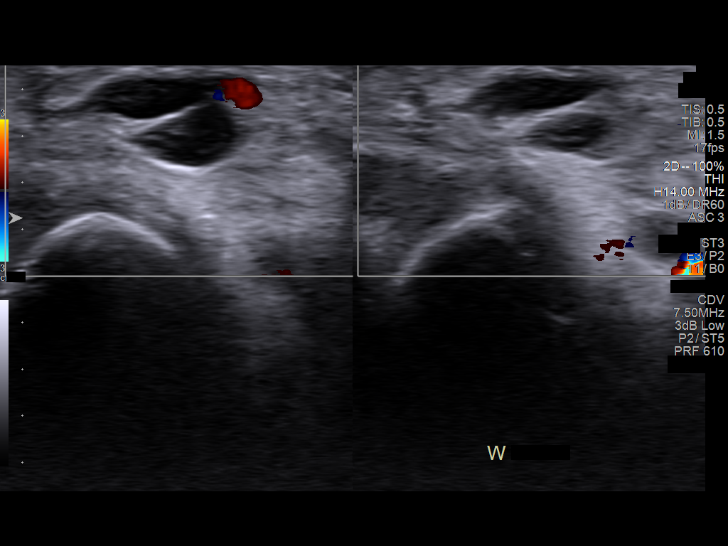
[im 10/17]
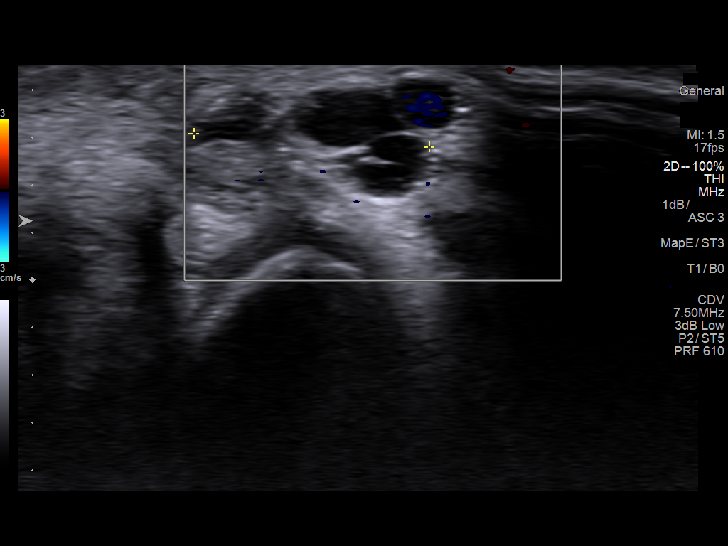
[im 11/17]
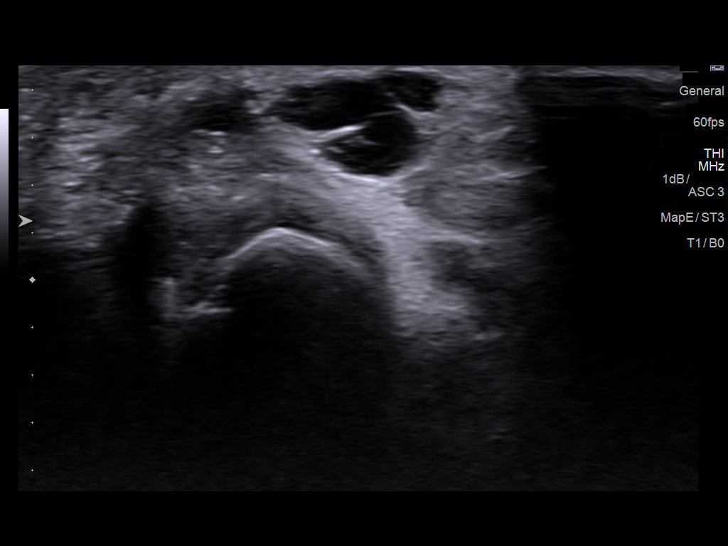
[im 12/17]
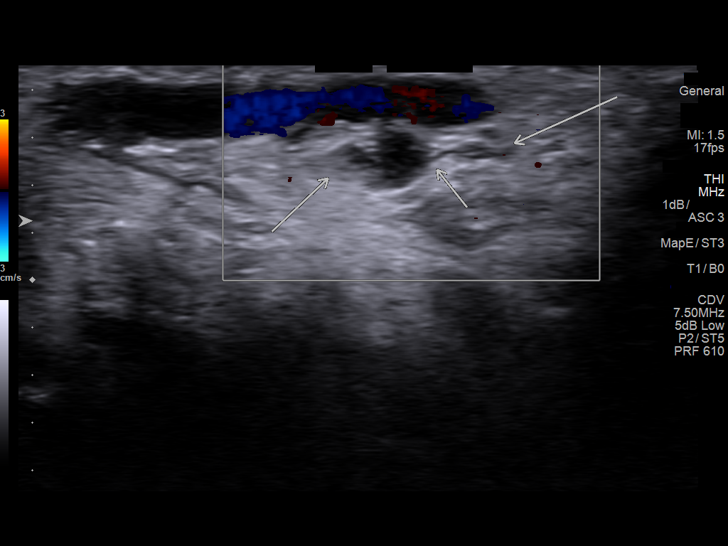
[im 13/17]
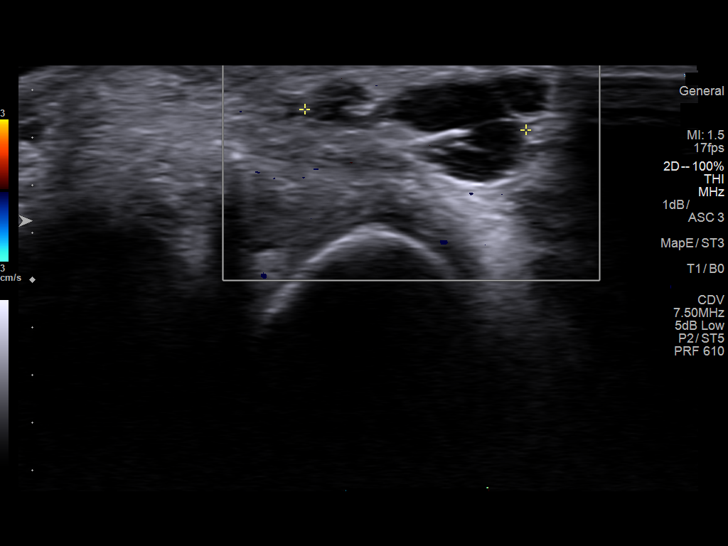
[im 14/17]
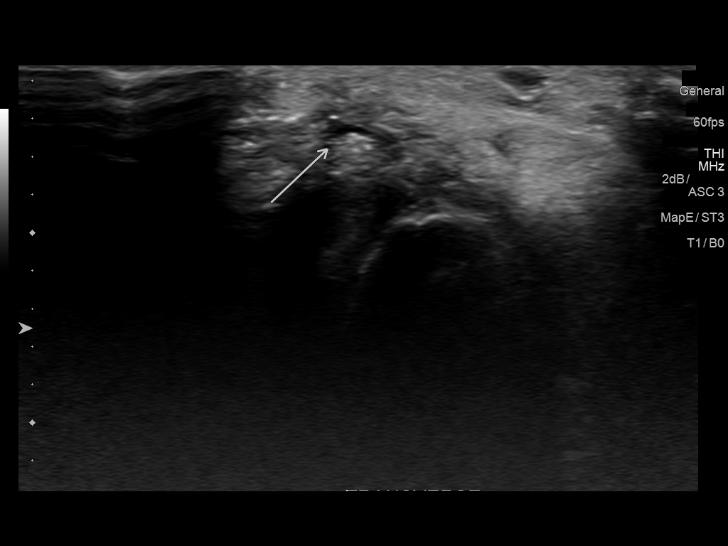
[im 16/17]
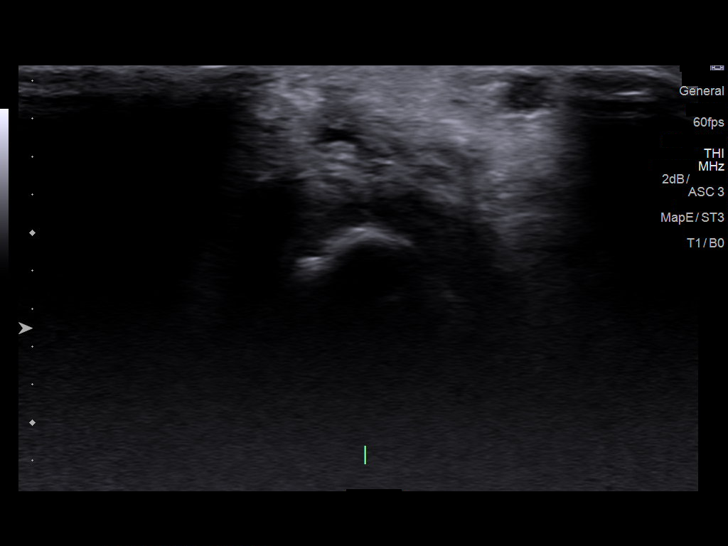
[im 17/17]
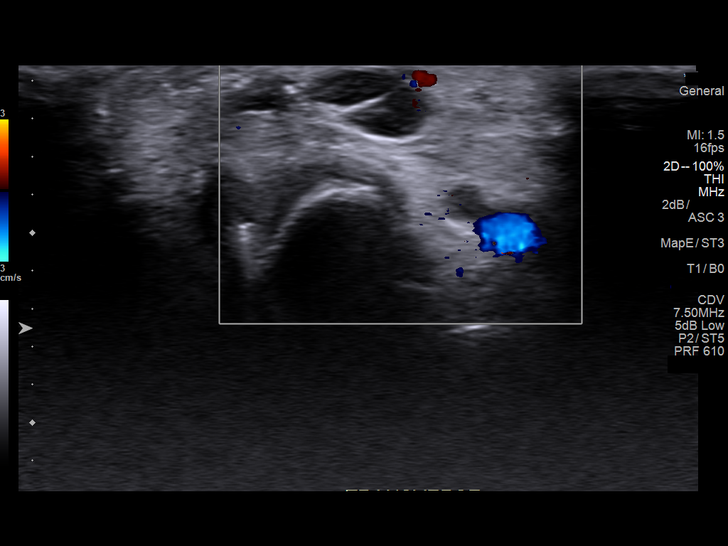

[14 of 17 positions shown; findings below may reference images not displayed]

FINDINGS: There is a slightly lobulated 1.1 x 1.0 x 0.4 cm ganglion cyst in
the palm of the left hand. This correlates with the palpable
abnormality. There is a vein immediately overlying the ganglion
cyst. There is a small amount of fluid extending into the adjacent
soft tissues around the ganglion cyst.
IMPRESSION: Ganglion cyst of the palmar aspect of the left wrist.

## 2018-06-20 DIAGNOSIS — F4323 Adjustment disorder with mixed anxiety and depressed mood: Secondary | ICD-10-CM | POA: Diagnosis not present

## 2018-06-27 DIAGNOSIS — F4323 Adjustment disorder with mixed anxiety and depressed mood: Secondary | ICD-10-CM | POA: Diagnosis not present

## 2018-07-11 DIAGNOSIS — F4323 Adjustment disorder with mixed anxiety and depressed mood: Secondary | ICD-10-CM | POA: Diagnosis not present

## 2018-07-20 DIAGNOSIS — F4323 Adjustment disorder with mixed anxiety and depressed mood: Secondary | ICD-10-CM | POA: Diagnosis not present

## 2018-08-01 DIAGNOSIS — F4323 Adjustment disorder with mixed anxiety and depressed mood: Secondary | ICD-10-CM | POA: Diagnosis not present

## 2018-08-17 DIAGNOSIS — J209 Acute bronchitis, unspecified: Secondary | ICD-10-CM | POA: Diagnosis not present

## 2018-08-17 DIAGNOSIS — H6981 Other specified disorders of Eustachian tube, right ear: Secondary | ICD-10-CM | POA: Diagnosis not present

## 2018-08-17 DIAGNOSIS — R0981 Nasal congestion: Secondary | ICD-10-CM | POA: Diagnosis not present

## 2018-11-20 DIAGNOSIS — Z125 Encounter for screening for malignant neoplasm of prostate: Secondary | ICD-10-CM | POA: Diagnosis not present

## 2018-11-20 DIAGNOSIS — Z1389 Encounter for screening for other disorder: Secondary | ICD-10-CM | POA: Diagnosis not present

## 2018-11-20 DIAGNOSIS — Z0000 Encounter for general adult medical examination without abnormal findings: Secondary | ICD-10-CM | POA: Diagnosis not present

## 2018-11-20 DIAGNOSIS — Z13220 Encounter for screening for lipoid disorders: Secondary | ICD-10-CM | POA: Diagnosis not present

## 2018-11-20 DIAGNOSIS — N529 Male erectile dysfunction, unspecified: Secondary | ICD-10-CM | POA: Diagnosis not present

## 2019-02-05 DIAGNOSIS — R52 Pain, unspecified: Secondary | ICD-10-CM | POA: Diagnosis not present

## 2019-02-05 DIAGNOSIS — R0789 Other chest pain: Secondary | ICD-10-CM | POA: Diagnosis not present

## 2019-05-25 DIAGNOSIS — S80862A Insect bite (nonvenomous), left lower leg, initial encounter: Secondary | ICD-10-CM | POA: Diagnosis not present

## 2019-05-25 DIAGNOSIS — N529 Male erectile dysfunction, unspecified: Secondary | ICD-10-CM | POA: Diagnosis not present

## 2019-05-25 DIAGNOSIS — G500 Trigeminal neuralgia: Secondary | ICD-10-CM | POA: Diagnosis not present

## 2019-09-16 DIAGNOSIS — R000 Tachycardia, unspecified: Secondary | ICD-10-CM | POA: Diagnosis not present

## 2019-09-16 DIAGNOSIS — T59811A Toxic effect of smoke, accidental (unintentional), initial encounter: Secondary | ICD-10-CM | POA: Insufficient documentation

## 2019-09-16 DIAGNOSIS — I499 Cardiac arrhythmia, unspecified: Secondary | ICD-10-CM | POA: Diagnosis not present

## 2019-09-16 DIAGNOSIS — Z20828 Contact with and (suspected) exposure to other viral communicable diseases: Secondary | ICD-10-CM | POA: Diagnosis not present

## 2019-09-16 DIAGNOSIS — R079 Chest pain, unspecified: Secondary | ICD-10-CM | POA: Insufficient documentation

## 2019-09-16 DIAGNOSIS — X031XXA Exposure to smoke in controlled fire, not in building or structure, initial encounter: Secondary | ICD-10-CM | POA: Diagnosis not present

## 2019-09-16 DIAGNOSIS — R002 Palpitations: Secondary | ICD-10-CM | POA: Diagnosis not present

## 2019-09-16 DIAGNOSIS — R062 Wheezing: Secondary | ICD-10-CM | POA: Diagnosis not present

## 2019-09-16 DIAGNOSIS — Z87891 Personal history of nicotine dependence: Secondary | ICD-10-CM | POA: Diagnosis not present

## 2019-09-16 DIAGNOSIS — R0902 Hypoxemia: Secondary | ICD-10-CM | POA: Diagnosis not present

## 2019-09-16 DIAGNOSIS — R05 Cough: Secondary | ICD-10-CM | POA: Diagnosis not present

## 2019-09-16 DIAGNOSIS — Z6825 Body mass index (BMI) 25.0-25.9, adult: Secondary | ICD-10-CM | POA: Diagnosis not present

## 2019-09-16 DIAGNOSIS — R0602 Shortness of breath: Secondary | ICD-10-CM | POA: Diagnosis not present

## 2019-09-16 DIAGNOSIS — Y92007 Garden or yard of unspecified non-institutional (private) residence as the place of occurrence of the external cause: Secondary | ICD-10-CM | POA: Diagnosis not present

## 2019-09-17 DIAGNOSIS — T59811A Toxic effect of smoke, accidental (unintentional), initial encounter: Secondary | ICD-10-CM | POA: Diagnosis not present

## 2019-11-22 DIAGNOSIS — Z1389 Encounter for screening for other disorder: Secondary | ICD-10-CM | POA: Diagnosis not present

## 2019-11-22 DIAGNOSIS — Z0000 Encounter for general adult medical examination without abnormal findings: Secondary | ICD-10-CM | POA: Diagnosis not present

## 2019-11-22 DIAGNOSIS — Z125 Encounter for screening for malignant neoplasm of prostate: Secondary | ICD-10-CM | POA: Diagnosis not present

## 2019-11-22 DIAGNOSIS — Z13220 Encounter for screening for lipoid disorders: Secondary | ICD-10-CM | POA: Diagnosis not present

## 2020-01-07 DIAGNOSIS — Z20828 Contact with and (suspected) exposure to other viral communicable diseases: Secondary | ICD-10-CM | POA: Diagnosis not present

## 2020-01-07 DIAGNOSIS — Z03818 Encounter for observation for suspected exposure to other biological agents ruled out: Secondary | ICD-10-CM | POA: Diagnosis not present

## 2020-02-18 DIAGNOSIS — F341 Dysthymic disorder: Secondary | ICD-10-CM | POA: Diagnosis not present

## 2020-02-25 DIAGNOSIS — F341 Dysthymic disorder: Secondary | ICD-10-CM | POA: Diagnosis not present

## 2020-06-11 DIAGNOSIS — Z23 Encounter for immunization: Secondary | ICD-10-CM | POA: Diagnosis not present

## 2020-06-11 DIAGNOSIS — S81811A Laceration without foreign body, right lower leg, initial encounter: Secondary | ICD-10-CM | POA: Diagnosis not present

## 2021-01-19 ENCOUNTER — Other Ambulatory Visit: Payer: Self-pay | Admitting: Internal Medicine

## 2021-01-19 ENCOUNTER — Ambulatory Visit
Admission: RE | Admit: 2021-01-19 | Discharge: 2021-01-19 | Disposition: A | Discharge status: Home / Self Care | Payer: BLUE CROSS/BLUE SHIELD | Source: Ambulatory Visit | Attending: Internal Medicine | Admitting: Internal Medicine

## 2021-01-19 DIAGNOSIS — R079 Chest pain, unspecified: Secondary | ICD-10-CM | POA: Diagnosis not present

## 2021-01-19 DIAGNOSIS — R0789 Other chest pain: Secondary | ICD-10-CM

## 2021-08-18 ENCOUNTER — Other Ambulatory Visit: Payer: Self-pay | Admitting: Internal Medicine

## 2021-08-18 DIAGNOSIS — R0789 Other chest pain: Secondary | ICD-10-CM | POA: Diagnosis not present

## 2021-08-18 DIAGNOSIS — Z87891 Personal history of nicotine dependence: Secondary | ICD-10-CM | POA: Diagnosis not present

## 2021-08-18 DIAGNOSIS — K625 Hemorrhage of anus and rectum: Secondary | ICD-10-CM | POA: Diagnosis not present

## 2021-09-07 ENCOUNTER — Ambulatory Visit
Admission: RE | Admit: 2021-09-07 | Discharge: 2021-09-07 | Disposition: A | Discharge status: Home / Self Care | Payer: BC Managed Care – PPO | Source: Ambulatory Visit | Attending: Internal Medicine | Admitting: Internal Medicine

## 2021-09-07 DIAGNOSIS — Z87891 Personal history of nicotine dependence: Secondary | ICD-10-CM | POA: Diagnosis not present

## 2021-12-18 DIAGNOSIS — R55 Syncope and collapse: Secondary | ICD-10-CM | POA: Diagnosis not present

## 2021-12-18 DIAGNOSIS — Z87891 Personal history of nicotine dependence: Secondary | ICD-10-CM | POA: Diagnosis not present

## 2021-12-18 DIAGNOSIS — R079 Chest pain, unspecified: Secondary | ICD-10-CM | POA: Diagnosis not present

## 2021-12-18 DIAGNOSIS — Z6825 Body mass index (BMI) 25.0-25.9, adult: Secondary | ICD-10-CM | POA: Diagnosis not present

## 2021-12-18 DIAGNOSIS — R9431 Abnormal electrocardiogram [ECG] [EKG]: Secondary | ICD-10-CM | POA: Diagnosis not present

## 2021-12-18 DIAGNOSIS — I498 Other specified cardiac arrhythmias: Secondary | ICD-10-CM | POA: Diagnosis not present

## 2021-12-18 DIAGNOSIS — I491 Atrial premature depolarization: Secondary | ICD-10-CM | POA: Diagnosis not present

## 2022-01-06 DIAGNOSIS — R0981 Nasal congestion: Secondary | ICD-10-CM | POA: Diagnosis not present

## 2022-01-06 DIAGNOSIS — R051 Acute cough: Secondary | ICD-10-CM | POA: Diagnosis not present

## 2022-11-17 ENCOUNTER — Other Ambulatory Visit: Payer: Self-pay | Admitting: Orthopedic Surgery

## 2022-11-17 DIAGNOSIS — M67432 Ganglion, left wrist: Secondary | ICD-10-CM

## 2022-12-09 ENCOUNTER — Ambulatory Visit
Admission: RE | Admit: 2022-12-09 | Discharge: 2022-12-09 | Disposition: A | Discharge status: Home / Self Care | Payer: 59 | Source: Ambulatory Visit | Attending: Orthopedic Surgery | Admitting: Orthopedic Surgery

## 2022-12-09 DIAGNOSIS — M67432 Ganglion, left wrist: Secondary | ICD-10-CM

## 2022-12-16 ENCOUNTER — Other Ambulatory Visit: Payer: Self-pay | Admitting: Orthopedic Surgery

## 2022-12-23 ENCOUNTER — Other Ambulatory Visit: Payer: Self-pay

## 2022-12-23 ENCOUNTER — Encounter (HOSPITAL_BASED_OUTPATIENT_CLINIC_OR_DEPARTMENT_OTHER): Payer: Self-pay | Admitting: Orthopedic Surgery

## 2022-12-28 NOTE — Progress Notes (Signed)

## 2022-12-30 ENCOUNTER — Encounter (HOSPITAL_BASED_OUTPATIENT_CLINIC_OR_DEPARTMENT_OTHER): Payer: Self-pay | Admitting: Orthopedic Surgery

## 2022-12-30 ENCOUNTER — Encounter (HOSPITAL_BASED_OUTPATIENT_CLINIC_OR_DEPARTMENT_OTHER)
Admission: RE | Disposition: A | Discharge status: Home / Self Care | Payer: Self-pay | Source: Home / Self Care | Attending: Orthopedic Surgery

## 2022-12-30 ENCOUNTER — Other Ambulatory Visit: Payer: Self-pay

## 2022-12-30 ENCOUNTER — Ambulatory Visit (HOSPITAL_BASED_OUTPATIENT_CLINIC_OR_DEPARTMENT_OTHER)
Admission: RE | Admit: 2022-12-30 | Discharge: 2022-12-30 | Disposition: A | Discharge status: Home / Self Care | Payer: 59 | Attending: Orthopedic Surgery | Admitting: Orthopedic Surgery

## 2022-12-30 ENCOUNTER — Ambulatory Visit (HOSPITAL_BASED_OUTPATIENT_CLINIC_OR_DEPARTMENT_OTHER): Payer: 59 | Admitting: Anesthesiology

## 2022-12-30 DIAGNOSIS — Z87891 Personal history of nicotine dependence: Secondary | ICD-10-CM | POA: Diagnosis not present

## 2022-12-30 DIAGNOSIS — M67432 Ganglion, left wrist: Secondary | ICD-10-CM

## 2022-12-30 DIAGNOSIS — G709 Myoneural disorder, unspecified: Secondary | ICD-10-CM | POA: Insufficient documentation

## 2022-12-30 DIAGNOSIS — Z79899 Other long term (current) drug therapy: Secondary | ICD-10-CM | POA: Diagnosis not present

## 2022-12-30 DIAGNOSIS — E7800 Pure hypercholesterolemia, unspecified: Secondary | ICD-10-CM | POA: Diagnosis not present

## 2022-12-30 HISTORY — DX: Pure hypercholesterolemia, unspecified: E78.00

## 2022-12-30 HISTORY — PX: GANGLION CYST EXCISION: SHX1691

## 2022-12-30 SURGERY — EXCISION, GANGLION CYST, WRIST
Anesthesia: General | Site: Wrist | Laterality: Left

## 2022-12-30 MED ORDER — MIDAZOLAM HCL 2 MG/2ML IJ SOLN
INTRAMUSCULAR | Status: DC | PRN
  Administered 2022-12-30: 2 mg via INTRAVENOUS

## 2022-12-30 MED ORDER — OXYCODONE HCL 5 MG PO TABS: 5.0000 mg | ORAL_TABLET | Freq: Once | ORAL | Status: DC | PRN

## 2022-12-30 MED ORDER — FENTANYL CITRATE (PF) 100 MCG/2ML IJ SOLN
INTRAMUSCULAR | Status: AC
  Filled 2022-12-30: qty 2

## 2022-12-30 MED ORDER — PROPOFOL 10 MG/ML IV BOLUS
INTRAVENOUS | Status: AC
  Filled 2022-12-30: qty 20

## 2022-12-30 MED ORDER — LIDOCAINE 2% (20 MG/ML) 5 ML SYRINGE
INTRAMUSCULAR | Status: AC
  Filled 2022-12-30: qty 5

## 2022-12-30 MED ORDER — PROMETHAZINE HCL 25 MG/ML IJ SOLN: 6.2500 mg | INTRAMUSCULAR | Status: DC | PRN

## 2022-12-30 MED ORDER — LACTATED RINGERS IV SOLN: INTRAVENOUS | Status: DC

## 2022-12-30 MED ORDER — ONDANSETRON HCL 4 MG/2ML IJ SOLN
INTRAMUSCULAR | Status: AC
  Filled 2022-12-30: qty 2

## 2022-12-30 MED ORDER — BUPIVACAINE HCL (PF) 0.25 % IJ SOLN
INTRAMUSCULAR | Status: AC
  Filled 2022-12-30: qty 30

## 2022-12-30 MED ORDER — CEFAZOLIN SODIUM-DEXTROSE 2-4 GM/100ML-% IV SOLN
INTRAVENOUS | Status: AC
  Filled 2022-12-30: qty 100

## 2022-12-30 MED ORDER — PROPOFOL 10 MG/ML IV BOLUS
INTRAVENOUS | Status: DC | PRN
  Administered 2022-12-30: 200 mg via INTRAVENOUS

## 2022-12-30 MED ORDER — OXYCODONE HCL 5 MG/5ML PO SOLN: 5.0000 mg | Freq: Once | ORAL | Status: DC | PRN

## 2022-12-30 MED ORDER — BUPIVACAINE HCL (PF) 0.25 % IJ SOLN
INTRAMUSCULAR | Status: DC | PRN
  Administered 2022-12-30: 9 mL

## 2022-12-30 MED ORDER — ACETAMINOPHEN 500 MG PO TABS
1000.0000 mg | ORAL_TABLET | Freq: Once | ORAL | Status: AC
  Administered 2022-12-30: 1000 mg via ORAL

## 2022-12-30 MED ORDER — PHENYLEPHRINE HCL (PRESSORS) 10 MG/ML IV SOLN
INTRAVENOUS | Status: DC | PRN
  Administered 2022-12-30: 160 ug via INTRAVENOUS
  Administered 2022-12-30: 240 ug via INTRAVENOUS

## 2022-12-30 MED ORDER — CEFAZOLIN SODIUM-DEXTROSE 2-3 GM-%(50ML) IV SOLR
INTRAVENOUS | Status: DC | PRN
  Administered 2022-12-30: 2 g via INTRAVENOUS

## 2022-12-30 MED ORDER — MIDAZOLAM HCL 2 MG/2ML IJ SOLN
INTRAMUSCULAR | Status: AC
  Filled 2022-12-30: qty 2

## 2022-12-30 MED ORDER — LACTATED RINGERS IV SOLN: INTRAVENOUS | Status: DC | PRN

## 2022-12-30 MED ORDER — KETOROLAC TROMETHAMINE 30 MG/ML IJ SOLN: 30.0000 mg | Freq: Once | INTRAMUSCULAR | Status: DC | PRN

## 2022-12-30 MED ORDER — CEFAZOLIN SODIUM-DEXTROSE 2-4 GM/100ML-% IV SOLN: 2.0000 g | INTRAVENOUS | Status: DC

## 2022-12-30 MED ORDER — FENTANYL CITRATE (PF) 100 MCG/2ML IJ SOLN: 25.0000 ug | INTRAMUSCULAR | Status: DC | PRN

## 2022-12-30 MED ORDER — EPHEDRINE 5 MG/ML INJ
INTRAVENOUS | Status: AC
  Filled 2022-12-30: qty 10

## 2022-12-30 MED ORDER — AMISULPRIDE (ANTIEMETIC) 5 MG/2ML IV SOLN: 10.0000 mg | Freq: Once | INTRAVENOUS | Status: DC | PRN

## 2022-12-30 MED ORDER — FENTANYL CITRATE (PF) 100 MCG/2ML IJ SOLN
INTRAMUSCULAR | Status: DC | PRN
  Administered 2022-12-30: 100 ug via INTRAVENOUS

## 2022-12-30 MED ORDER — ONDANSETRON HCL 4 MG/2ML IJ SOLN
INTRAMUSCULAR | Status: DC | PRN
  Administered 2022-12-30: 4 mg via INTRAVENOUS

## 2022-12-30 MED ORDER — LIDOCAINE HCL (CARDIAC) PF 100 MG/5ML IV SOSY
PREFILLED_SYRINGE | INTRAVENOUS | Status: DC | PRN
  Administered 2022-12-30: 50 mg via INTRAVENOUS

## 2022-12-30 MED ORDER — ACETAMINOPHEN 500 MG PO TABS
ORAL_TABLET | ORAL | Status: AC
  Filled 2022-12-30: qty 2

## 2022-12-30 MED ORDER — DEXAMETHASONE SODIUM PHOSPHATE 10 MG/ML IJ SOLN
INTRAMUSCULAR | Status: DC | PRN
  Administered 2022-12-30: 5 mg via INTRAVENOUS

## 2022-12-30 MED ORDER — HYDROCODONE-ACETAMINOPHEN 5-325 MG PO TABS: ORAL_TABLET | ORAL | 0 refills | Status: DC

## 2022-12-30 SURGICAL SUPPLY — 47 items
APL PRP STRL LF DISP 70% ISPRP (MISCELLANEOUS) ×1
APL SKNCLS STERI-STRIP NONHPOA (GAUZE/BANDAGES/DRESSINGS)
BENZOIN TINCTURE PRP APPL 2/3 (GAUZE/BANDAGES/DRESSINGS) IMPLANT
BLADE MINI RND TIP GREEN BEAV (BLADE) IMPLANT
BLADE SURG 15 STRL LF DISP TIS (BLADE) ×2 IMPLANT
BLADE SURG 15 STRL SS (BLADE) ×2
BNDG CMPR 5X2 KNTD ELC UNQ LF (GAUZE/BANDAGES/DRESSINGS)
BNDG CMPR 5X3 KNIT ELC UNQ LF (GAUZE/BANDAGES/DRESSINGS) ×1
BNDG CMPR 9X4 STRL LF SNTH (GAUZE/BANDAGES/DRESSINGS)
BNDG ELASTIC 2INX 5YD STR LF (GAUZE/BANDAGES/DRESSINGS) IMPLANT
BNDG ELASTIC 3INX 5YD STR LF (GAUZE/BANDAGES/DRESSINGS) ×1 IMPLANT
BNDG ESMARK 4X9 LF (GAUZE/BANDAGES/DRESSINGS) IMPLANT
BNDG GAUZE DERMACEA FLUFF 4 (GAUZE/BANDAGES/DRESSINGS) ×1 IMPLANT
BNDG GZE DERMACEA 4 6PLY (GAUZE/BANDAGES/DRESSINGS) ×1
CHLORAPREP W/TINT 26 (MISCELLANEOUS) ×1 IMPLANT
CORD BIPOLAR FORCEPS 12FT (ELECTRODE) ×1 IMPLANT
COVER BACK TABLE 60X90IN (DRAPES) ×1 IMPLANT
COVER MAYO STAND STRL (DRAPES) ×1 IMPLANT
CUFF TOURN SGL QUICK 18X4 (TOURNIQUET CUFF) ×1 IMPLANT
DRAPE EXTREMITY T 121X128X90 (DISPOSABLE) ×1 IMPLANT
DRAPE SURG 17X23 STRL (DRAPES) ×1 IMPLANT
GAUZE PAD ABD 8X10 STRL (GAUZE/BANDAGES/DRESSINGS) IMPLANT
GAUZE SPONGE 4X4 12PLY STRL (GAUZE/BANDAGES/DRESSINGS) ×1 IMPLANT
GAUZE XEROFORM 1X8 LF (GAUZE/BANDAGES/DRESSINGS) ×1 IMPLANT
GLOVE BIO SURGEON STRL SZ7.5 (GLOVE) ×1 IMPLANT
GLOVE BIOGEL PI IND STRL 8 (GLOVE) ×1 IMPLANT
GOWN STRL REUS W/ TWL LRG LVL3 (GOWN DISPOSABLE) ×1 IMPLANT
GOWN STRL REUS W/TWL LRG LVL3 (GOWN DISPOSABLE) ×1
GOWN STRL REUS W/TWL XL LVL3 (GOWN DISPOSABLE) ×1 IMPLANT
NDL HYPO 25X1 1.5 SAFETY (NEEDLE) IMPLANT
NEEDLE HYPO 25X1 1.5 SAFETY (NEEDLE) IMPLANT
NS IRRIG 1000ML POUR BTL (IV SOLUTION) ×1 IMPLANT
PACK BASIN DAY SURGERY FS (CUSTOM PROCEDURE TRAY) ×1 IMPLANT
PAD CAST 3X4 CTTN HI CHSV (CAST SUPPLIES) IMPLANT
PADDING CAST ABS COTTON 4X4 ST (CAST SUPPLIES) ×1 IMPLANT
PADDING CAST COTTON 3X4 STRL (CAST SUPPLIES)
SPLINT PLASTER CAST XFAST 3X15 (CAST SUPPLIES) IMPLANT
STOCKINETTE 4X48 STRL (DRAPES) ×1 IMPLANT
STRIP CLOSURE SKIN 1/2X4 (GAUZE/BANDAGES/DRESSINGS) IMPLANT
SUT ETHILON 4 0 PS 2 18 (SUTURE) IMPLANT
SUT MNCRL AB 4-0 PS2 18 (SUTURE) IMPLANT
SUT MON AB 5-0 PS2 18 (SUTURE) IMPLANT
SUT VIC AB 4-0 P2 18 (SUTURE) IMPLANT
SYR BULB EAR ULCER 3OZ GRN STR (SYRINGE) ×1 IMPLANT
SYR CONTROL 10ML LL (SYRINGE) IMPLANT
TOWEL GREEN STERILE FF (TOWEL DISPOSABLE) ×2 IMPLANT
UNDERPAD 30X36 HEAVY ABSORB (UNDERPADS AND DIAPERS) ×1 IMPLANT

## 2022-12-30 NOTE — H&P (Signed)
Steven Reilly is an 52 y.o. male.   Chief Complaint: recurrent ganglion HPI: 52 yo male with left wrist recurrent ganglion cyst.  This is bothersome to him.  He wishes to have it removed.  Allergies:  Allergies  Allergen Reactions   Prednisone     "makes me uncomfortable"    Past Medical History:  Diagnosis Date   Hearing loss in left ear    High cholesterol    Lyme disease    Seizure-like activity ([place])    Syncope    Trigeminal neuralgia     Past Surgical History:  Procedure Laterality Date   APPENDECTOMY  1986   GANGLION CYST EXCISION Left 12/2015   MASS EXCISION Left 09/27/2017   Procedure: EXCISION LEFT WRIST RECURRENT VOLAR GANGLION CYST;  Surgeon: Leanora Cover, MD;  Location: [place];  Service: Orthopedics;  Laterality: Left;    Family History: Family History  Problem Relation Age of Onset   Emphysema Maternal Aunt        smoked   Allergies Unknown        "everybody"   Heart disease Father    Melanoma Father    Prostate cancer Father     Social History:   reports that he quit smoking about 8 years ago. His smoking use included cigarettes. He has a 9.75 pack-year smoking history. He has never used smokeless tobacco. He reports current alcohol use. He reports that he does not use drugs.  Medications: Medications Prior to Admission  Medication Sig Dispense Refill   atorvastatin (LIPITOR) 10 MG tablet Take by mouth daily.     carbamazepine (TEGRETOL) 200 MG tablet Take by mouth.     ibuprofen (ADVIL) 200 MG tablet Take 200 mg by mouth every 6 (six) hours as needed for mild pain.     pregabalin (LYRICA) 100 MG capsule Take 100 mg by mouth.     oxyCODONE-acetaminophen (PERCOCET) 5-325 MG tablet 1/2 - 1 tabs po q6 hours prn pain 10 tablet 0    No results found for this or any previous visit (from the past 48 hour(s)).  No results found.    Blood pressure 127/83, pulse 66, temperature 98 F (36.7 C), temperature source Oral, resp. rate 16,  height '5\' 11"'$  (1.803 m), weight 81.3 kg, SpO2 99 %.  General appearance: alert, cooperative, and appears stated age Head: Normocephalic, without obvious abnormality, atraumatic Neck: supple, symmetrical, trachea midline Extremities: Intact sensation and capillary refill all digits.  +epl/fpl/io.  No wounds.  Pulses: 2+ and symmetric Skin: Skin color, texture, turgor normal. No rashes or lesions Neurologic: Grossly normal Incision/Wound: none  Assessment/Plan Left wrist recurrent volar ganglion cyst.  Non operative and operative treatment options have been discussed with the patient and patient wishes to proceed with operative treatment. Risks, benefits, and alternatives of surgery have been discussed and the patient agrees with the plan of care.   Leanora Cover 12/30/2022, 12:40 PM

## 2022-12-30 NOTE — Anesthesia Procedure Notes (Signed)
Procedure Name: LMA Insertion Date/Time: 12/30/2022 1:10 PM  Performed by: Verita Lamb, CRNAPre-anesthesia Checklist: Patient identified, Emergency Drugs available, Suction available and Patient being monitored Patient Re-evaluated:Patient Re-evaluated prior to induction Oxygen Delivery Method: Circle system utilized Preoxygenation: Pre-oxygenation with 100% oxygen Induction Type: IV induction Ventilation: Mask ventilation without difficulty LMA: LMA inserted LMA Size: 4.0 Number of attempts: 1 Airway Equipment and Method: Bite block Placement Confirmation: positive ETCO2, breath sounds checked- equal and bilateral and CO2 detector Tube secured with: Tape Dental Injury: Teeth and Oropharynx as per pre-operative assessment

## 2022-12-30 NOTE — Anesthesia Postprocedure Evaluation (Signed)
Anesthesia Post Note  Patient: Altan Bodley  Procedure(s) Performed: EXCISION LEFT WRIST RECURRENT VOLAR GANGLION (Left: Wrist)     Patient location during evaluation: PACU Anesthesia Type: General Level of consciousness: awake Pain management: pain level controlled Vital Signs Assessment: post-procedure vital signs reviewed and stable Respiratory status: spontaneous breathing, nonlabored ventilation and respiratory function stable Cardiovascular status: blood pressure returned to baseline and stable Postop Assessment: no apparent nausea or vomiting Anesthetic complications: no  No notable events documented.  Last Vitals:  Vitals:   12/30/22 1415 12/30/22 1436  BP: 112/80 115/86  Pulse: 63 68  Resp: 15 16  Temp:  (!) 36.3 C  SpO2: 98% 99%    Last Pain:  Vitals:   12/30/22 1428  TempSrc:   PainSc: 2                  Kaspar Albornoz P Claudie Brickhouse

## 2022-12-30 NOTE — Transfer of Care (Signed)
Immediate Anesthesia Transfer of Care Note  Patient: [name]  Procedure(s) Performed: EXCISION LEFT WRIST RECURRENT VOLAR GANGLION (Left: Wrist)  Patient Location: PACU  Anesthesia Type:General  Level of Consciousness: awake, alert , and oriented  Airway & Oxygen Therapy: Patient Spontanous Breathing and Patient connected to face mask oxygen  Post-op Assessment: Report given to RN and Post -op Vital signs reviewed and stable  Post vital signs: Reviewed and stable  Last Vitals:  Vitals Value Taken Time  BP 110/69 12/30/22 1347  Temp    Pulse 62 12/30/22 1349  Resp 12 12/30/22 1349  SpO2 99 % 12/30/22 1349  Vitals shown include unvalidated device data.  Last Pain:  Vitals:   12/30/22 1139  TempSrc: Oral  PainSc: 0-No pain         Complications: No notable events documented.

## 2022-12-30 NOTE — Anesthesia Preprocedure Evaluation (Addendum)
Anesthesia Evaluation  Patient identified by MRN, date of birth, ID band Patient awake    Reviewed: Allergy & Precautions, NPO status , Patient's Chart, lab work & pertinent test results  Airway Mallampati: II  TM Distance: >3 FB Neck ROM: Full    Dental  (+) Chipped,    Pulmonary former smoker   Pulmonary exam normal        Cardiovascular negative cardio ROS Normal cardiovascular exam     Neuro/Psych  Neuromuscular disease  negative psych ROS   GI/Hepatic negative GI ROS, Neg liver ROS,,,  Endo/Other  negative endocrine ROS    Renal/GU negative Renal ROS     Musculoskeletal negative musculoskeletal ROS (+)    Abdominal   Peds  Hematology negative hematology ROS (+)   Anesthesia Other Findings RECURRENT LEFT WRIST VOLAR GANGLION  Reproductive/Obstetrics                             Anesthesia Physical Anesthesia Plan  ASA: 2  Anesthesia Plan: General   Post-op Pain Management:    Induction: Intravenous  PONV Risk Score and Plan: 2 and Ondansetron, Dexamethasone, Midazolam and Treatment may vary due to age or medical condition  Airway Management Planned: LMA  Additional Equipment:   Intra-op Plan:   Post-operative Plan: Extubation in OR  Informed Consent: I have reviewed the patients History and Physical, chart, labs and discussed the procedure including the risks, benefits and alternatives for the proposed anesthesia with the patient or authorized representative who has indicated his/her understanding and acceptance.     Dental advisory given  Plan Discussed with: CRNA  Anesthesia Plan Comments:        Anesthesia Quick Evaluation

## 2022-12-30 NOTE — Op Note (Signed)
I assisted Surgeon(s) and Role:    * Leanora Cover, MD - Primary    * Daryll Brod, MD - Assisting on the Procedure(s): EXCISION LEFT WRIST RECURRENT VOLAR GANGLION on 12/30/2022.  I provided assistance on this case as follows: Set up, approach, identification and protection radial artery, identification of the cyst with excision, closure of the wound application dressing.  Electronically signed by: Daryll Brod, MD Date: 12/30/2022 Time: 1:44 PM

## 2022-12-30 NOTE — Op Note (Signed)
NAME: Emilian Hinnenkamp MEDICAL RECORD NO: [id number] DATE OF BIRTH: 03/14/1971 FACILITY[facility] LOCATION: [facility] PHYSICIAN: Tennis Must, MD   OPERATIVE REPORT   DATE OF PROCEDURE: 12/30/22    PREOPERATIVE DIAGNOSIS:  Left wrist recurrent volar ganglion cyst   POSTOPERATIVE DIAGNOSIS:  Left wrist recurrent volar ganglion cyst   PROCEDURE:  Excision left wrist recurrent volar ganglion cyst   SURGEON:  Leanora Cover, M.D.   ASSISTANT: Daryll Brod, MD   ANESTHESIA:  General   INTRAVENOUS FLUIDS:  Per anesthesia flow sheet.   ESTIMATED BLOOD LOSS:  Minimal.   COMPLICATIONS:  None.   SPECIMENS:  left wrist ganglion to pathology   TOURNIQUET TIME:    Total Tourniquet Time Documented: Upper Arm (Left) - 16 minutes Total: Upper Arm (Left) - 16 minutes    DISPOSITION:  Stable to PACU.   INDICATIONS:  52 yo male with left wrist recurrent volar ganglion cyst.  It is bothersome to him.  He wishes to have it removed.  Risks, benefits and alternatives of surgery were discussed including the risks of blood loss, infection, damage to nerves, vessels, tendons, ligaments, bone for surgery, need for additional surgery, complications with wound healing, continued pain, stiffness, , recurrence.  He voiced understanding of these risks and elected to proceed.  OPERATIVE COURSE:  After being identified preoperatively by myself,  the patient and I agreed on the procedure and site of the procedure.  The surgical site was marked.  Surgical consent had been signed. Preoperative IV antibiotic prophylaxis was given. He was transferred to the operating room and placed on the operating table in supine position with the Left upper extremity on an arm board.  General anesthesia was induced by the anesthesiologist.  Left upper extremity was prepped and draped in normal sterile orthopedic fashion.  A surgical pause was performed between the surgeons, anesthesia, and operating room staff and  all were in agreement as to the patient, procedure, and site of procedure.  Tourniquet at the proximal aspect of the extremity was inflated to 250 mmHg after exsanguination of the arm with an Esmarch bandage.  Previous incision was followed.  This was carried in subcutaneous tissues by spreading technique.  There is some scar formation.  Bipolar trial used obtain hemostasis.  The cyst was easily identified.  Was cleared of soft tissue apposition.  There was a branch of artery that was carefully freed off from the cyst and protected.  The cyst was coming from the volar aspect of the radiocarpal joint just radial to the FCR tendon.  The stalk was traced down and then transected.  The cyst was removed and sent to pathology for examination.  The rent in the capsule was treated with bipolar and debrided with the rongeurs.  It was then closed with 4-0 Vicryl suture in a figure-of-eight fashion.  The wound was copiously irrigated with sterile saline.  It was then closed with 4-0 nylon in a horizontal mattress fashion.  Was injected with quarter percent plain Marcaine to aid in postoperative analgesia.  Was dressed with sterile Xeroform 4 x 4's and an ABD used as a splint.  This was wrapped with Kerlix and Ace bandage.  The tourniquet was deflated at 16 minutes.  Fingertips were pink with brisk capillary refill after deflation of tourniquet.  The operative  drapes were broken down.  The patient was awoken from anesthesia safely.  He was transferred back to the stretcher and taken to PACU in stable condition.  I will see him back in the office in 1 week for postoperative followup.  I will give him a prescription for Norco 5/325 1-2 tabs PO q6 hours prn pain, dispense # 15.   Leanora Cover, MD Electronically signed, 12/30/22

## 2022-12-30 NOTE — Discharge Instructions (Addendum)
  Post Anesthesia Home Care Instructions  Activity: Get plenty of rest for the remainder of the day. A responsible individual must stay with you for 24 hours following the procedure.  For the next 24 hours, DO NOT: -Drive a car -Paediatric nurse -Drink alcoholic beverages -Take any medication unless instructed by your physician -Make any legal decisions or sign important papers.  Meals: Start with liquid foods such as gelatin or soup. Progress to regular foods as tolerated. Avoid greasy, spicy, heavy foods. If nausea and/or vomiting occur, drink only clear liquids until the nausea and/or vomiting subsides. Call your physician if vomiting continues.  Special Instructions/Symptoms: Your throat may feel dry or sore from the anesthesia or the breathing tube placed in your throat during surgery. If this causes discomfort, gargle with warm salt water. The discomfort should disappear within 24 hours.  If you had a scopolamine patch placed behind your ear for the management of post- operative nausea and/or vomiting:  1. The medication in the patch is effective for 72 hours, after which it should be removed.  Wrap patch in a tissue and discard in the trash. Wash hands thoroughly with soap and water. 2. You may remove the patch earlier than 72 hours if you experience unpleasant side effects which may include dry mouth, dizziness or visual disturbances. 3. Avoid touching the patch. Wash your hands with soap and water after contact with the patch.    [facility] Instructions Hand Surgery  Wound Care: Keep your hand elevated above the level of your heart.  Do not allow it to dangle by your side.  Keep the dressing dry and do not remove it unless your doctor advises you to do so.  He will usually change it at the time of your post-op visit.  Moving your fingers is advised to stimulate circulation but will depend on the site of your surgery.  If you have a splint applied, your doctor will advise you  regarding movement.  Activity: Do not drive or operate machinery today.  Rest today and then you may return to your normal activity and work as indicated by your physician.  Diet:  Drink liquids today or eat a light diet.  You may resume a regular diet tomorrow.    General expectations: Pain for two to three days. Fingers may become slightly swollen.  Call your doctor if any of the following occur: Severe pain not relieved by pain medication. Elevated temperature. Dressing soaked with blood. Inability to move fingers. White or bluish color to fingers.   Next dose of Tyenol after 5:45pm today, if needed.

## 2022-12-31 ENCOUNTER — Encounter (HOSPITAL_BASED_OUTPATIENT_CLINIC_OR_DEPARTMENT_OTHER): Payer: Self-pay | Admitting: Orthopedic Surgery

## 2022-12-31 LAB — SURGICAL PATHOLOGY

## 2023-01-31 DIAGNOSIS — G500 Trigeminal neuralgia: Secondary | ICD-10-CM | POA: Insufficient documentation

## 2023-05-19 ENCOUNTER — Other Ambulatory Visit: Payer: Self-pay | Admitting: Orthopedic Surgery

## 2023-05-19 DIAGNOSIS — M67431 Ganglion, right wrist: Secondary | ICD-10-CM

## 2023-06-14 ENCOUNTER — Encounter: Payer: Self-pay | Admitting: Orthopedic Surgery

## 2023-06-15 ENCOUNTER — Encounter: Payer: Self-pay | Admitting: Orthopedic Surgery

## 2023-06-17 ENCOUNTER — Other Ambulatory Visit: Payer: Self-pay | Admitting: Orthopedic Surgery

## 2023-06-17 ENCOUNTER — Ambulatory Visit
Admission: RE | Admit: 2023-06-17 | Discharge: 2023-06-17 | Disposition: A | Discharge status: Home / Self Care | Payer: 59 | Source: Ambulatory Visit | Attending: Orthopedic Surgery | Admitting: Orthopedic Surgery

## 2023-06-17 DIAGNOSIS — M67432 Ganglion, left wrist: Secondary | ICD-10-CM

## 2023-06-17 DIAGNOSIS — M67431 Ganglion, right wrist: Secondary | ICD-10-CM

## 2023-06-17 DIAGNOSIS — S63502A Unspecified sprain of left wrist, initial encounter: Secondary | ICD-10-CM

## 2023-06-17 MED ORDER — IOPAMIDOL (ISOVUE-M 200) INJECTION 41%
3.0000 mL | Freq: Once | INTRAMUSCULAR | Status: AC
  Administered 2023-06-17: 3 mL via INTRA_ARTICULAR

## 2023-06-21 ENCOUNTER — Other Ambulatory Visit: Payer: Self-pay | Admitting: Orthopedic Surgery

## 2023-06-21 DIAGNOSIS — M67431 Ganglion, right wrist: Secondary | ICD-10-CM

## 2023-07-26 DIAGNOSIS — E559 Vitamin D deficiency, unspecified: Secondary | ICD-10-CM | POA: Insufficient documentation

## 2023-08-09 ENCOUNTER — Other Ambulatory Visit: Payer: Self-pay | Admitting: Orthopedic Surgery

## 2023-09-13 ENCOUNTER — Encounter (HOSPITAL_BASED_OUTPATIENT_CLINIC_OR_DEPARTMENT_OTHER): Payer: Self-pay | Admitting: Orthopedic Surgery

## 2023-09-13 ENCOUNTER — Other Ambulatory Visit: Payer: Self-pay

## 2023-09-22 ENCOUNTER — Other Ambulatory Visit: Payer: Self-pay

## 2023-09-22 ENCOUNTER — Encounter (HOSPITAL_BASED_OUTPATIENT_CLINIC_OR_DEPARTMENT_OTHER)
Admission: RE | Disposition: A | Discharge status: Home / Self Care | Payer: Self-pay | Source: Ambulatory Visit | Attending: Orthopedic Surgery

## 2023-09-22 ENCOUNTER — Ambulatory Visit (HOSPITAL_BASED_OUTPATIENT_CLINIC_OR_DEPARTMENT_OTHER): Payer: Self-pay | Admitting: Anesthesiology

## 2023-09-22 ENCOUNTER — Ambulatory Visit (HOSPITAL_BASED_OUTPATIENT_CLINIC_OR_DEPARTMENT_OTHER)
Admission: RE | Admit: 2023-09-22 | Discharge: 2023-09-22 | Disposition: A | Discharge status: Home / Self Care | Payer: No Typology Code available for payment source | Source: Ambulatory Visit | Attending: Orthopedic Surgery | Admitting: Orthopedic Surgery

## 2023-09-22 ENCOUNTER — Ambulatory Visit (HOSPITAL_BASED_OUTPATIENT_CLINIC_OR_DEPARTMENT_OTHER): Payer: No Typology Code available for payment source | Admitting: Anesthesiology

## 2023-09-22 ENCOUNTER — Encounter (HOSPITAL_BASED_OUTPATIENT_CLINIC_OR_DEPARTMENT_OTHER): Payer: Self-pay | Admitting: Orthopedic Surgery

## 2023-09-22 DIAGNOSIS — Z87891 Personal history of nicotine dependence: Secondary | ICD-10-CM | POA: Diagnosis not present

## 2023-09-22 DIAGNOSIS — M67432 Ganglion, left wrist: Secondary | ICD-10-CM | POA: Insufficient documentation

## 2023-09-22 HISTORY — PX: GANGLION CYST EXCISION: SHX1691

## 2023-09-22 SURGERY — EXCISION, GANGLION CYST, WRIST
Anesthesia: General | Site: Wrist | Laterality: Left

## 2023-09-22 MED ORDER — OXYCODONE HCL 5 MG/5ML PO SOLN: 5.0000 mg | Freq: Once | ORAL | Status: DC | PRN

## 2023-09-22 MED ORDER — FENTANYL CITRATE (PF) 100 MCG/2ML IJ SOLN
INTRAMUSCULAR | Status: AC
  Filled 2023-09-22: qty 2

## 2023-09-22 MED ORDER — MIDAZOLAM HCL 2 MG/2ML IJ SOLN
INTRAMUSCULAR | Status: AC
Start: 2023-09-22 — End: ?
  Filled 2023-09-22: qty 2

## 2023-09-22 MED ORDER — PHENYLEPHRINE 80 MCG/ML (10ML) SYRINGE FOR IV PUSH (FOR BLOOD PRESSURE SUPPORT)
PREFILLED_SYRINGE | INTRAVENOUS | Status: AC
  Filled 2023-09-22: qty 10

## 2023-09-22 MED ORDER — BUPIVACAINE HCL (PF) 0.25 % IJ SOLN
INTRAMUSCULAR | Status: DC | PRN
  Administered 2023-09-22: 9 mL

## 2023-09-22 MED ORDER — OXYCODONE-ACETAMINOPHEN 5-325 MG PO TABS: ORAL_TABLET | ORAL | 0 refills | Status: AC

## 2023-09-22 MED ORDER — PHENYLEPHRINE 80 MCG/ML (10ML) SYRINGE FOR IV PUSH (FOR BLOOD PRESSURE SUPPORT)
PREFILLED_SYRINGE | INTRAVENOUS | Status: DC | PRN
  Administered 2023-09-22 (×3): 80 ug via INTRAVENOUS

## 2023-09-22 MED ORDER — FENTANYL CITRATE (PF) 100 MCG/2ML IJ SOLN
25.0000 ug | INTRAMUSCULAR | Status: DC | PRN
  Administered 2023-09-22 (×2): 50 ug via INTRAVENOUS

## 2023-09-22 MED ORDER — FENTANYL CITRATE (PF) 250 MCG/5ML IJ SOLN
INTRAMUSCULAR | Status: DC | PRN
  Administered 2023-09-22: 100 ug via INTRAVENOUS

## 2023-09-22 MED ORDER — ACETAMINOPHEN 325 MG PO TABS: 325.0000 mg | ORAL_TABLET | ORAL | Status: DC | PRN

## 2023-09-22 MED ORDER — CEFAZOLIN SODIUM-DEXTROSE 2-4 GM/100ML-% IV SOLN
2.0000 g | INTRAVENOUS | Status: AC
  Administered 2023-09-22: 2 g via INTRAVENOUS

## 2023-09-22 MED ORDER — LIDOCAINE 2% (20 MG/ML) 5 ML SYRINGE
INTRAMUSCULAR | Status: AC
  Filled 2023-09-22: qty 5

## 2023-09-22 MED ORDER — CEFAZOLIN SODIUM-DEXTROSE 2-4 GM/100ML-% IV SOLN
INTRAVENOUS | Status: AC
  Filled 2023-09-22: qty 100

## 2023-09-22 MED ORDER — LIDOCAINE 2% (20 MG/ML) 5 ML SYRINGE
INTRAMUSCULAR | Status: DC | PRN
  Administered 2023-09-22: 100 mg via INTRAVENOUS

## 2023-09-22 MED ORDER — DEXAMETHASONE SODIUM PHOSPHATE 10 MG/ML IJ SOLN
INTRAMUSCULAR | Status: DC | PRN
  Administered 2023-09-22: 10 mg via INTRAVENOUS

## 2023-09-22 MED ORDER — MEPERIDINE HCL 25 MG/ML IJ SOLN: 6.2500 mg | INTRAMUSCULAR | Status: DC | PRN

## 2023-09-22 MED ORDER — LACTATED RINGERS IV SOLN: INTRAVENOUS | Status: DC

## 2023-09-22 MED ORDER — SODIUM CHLORIDE 0.9 % IV SOLN: INTRAVENOUS | Status: DC | PRN

## 2023-09-22 MED ORDER — ONDANSETRON HCL 4 MG/2ML IJ SOLN: 4.0000 mg | Freq: Once | INTRAMUSCULAR | Status: DC | PRN

## 2023-09-22 MED ORDER — DEXAMETHASONE SODIUM PHOSPHATE 10 MG/ML IJ SOLN
INTRAMUSCULAR | Status: AC
  Filled 2023-09-22: qty 1

## 2023-09-22 MED ORDER — OXYCODONE HCL 5 MG PO TABS: 5.0000 mg | ORAL_TABLET | Freq: Once | ORAL | Status: DC | PRN

## 2023-09-22 MED ORDER — ONDANSETRON HCL 4 MG/2ML IJ SOLN
INTRAMUSCULAR | Status: AC
  Filled 2023-09-22: qty 2

## 2023-09-22 MED ORDER — ACETAMINOPHEN 160 MG/5ML PO SOLN: 325.0000 mg | ORAL | Status: DC | PRN

## 2023-09-22 MED ORDER — ONDANSETRON HCL 4 MG/2ML IJ SOLN
INTRAMUSCULAR | Status: DC | PRN
  Administered 2023-09-22: 4 mg via INTRAVENOUS

## 2023-09-22 MED ORDER — PROPOFOL 10 MG/ML IV BOLUS
INTRAVENOUS | Status: DC | PRN
  Administered 2023-09-22: 200 mg via INTRAVENOUS

## 2023-09-22 MED ORDER — 0.9 % SODIUM CHLORIDE (POUR BTL) OPTIME
TOPICAL | Status: DC | PRN
  Administered 2023-09-22: 100 mL

## 2023-09-22 MED ORDER — MIDAZOLAM HCL 5 MG/5ML IJ SOLN
INTRAMUSCULAR | Status: DC | PRN
  Administered 2023-09-22: 2 mg via INTRAVENOUS

## 2023-09-22 SURGICAL SUPPLY — 67 items
BENZOIN TINCTURE PRP APPL 2/3 (GAUZE/BANDAGES/DRESSINGS) IMPLANT
BLADE EAR TYMPAN 2.5 60D BEAV (BLADE) IMPLANT
BLADE MINI RND TIP GREEN BEAV (BLADE) IMPLANT
BLADE SURG 15 STRL LF DISP TIS (BLADE) ×2 IMPLANT
BNDG ELASTIC 2INX 5YD STR LF (GAUZE/BANDAGES/DRESSINGS) IMPLANT
BNDG ELASTIC 3INX 5YD STR LF (GAUZE/BANDAGES/DRESSINGS) ×1 IMPLANT
BNDG ELASTIC 4INX 5YD STR LF (GAUZE/BANDAGES/DRESSINGS) IMPLANT
BNDG ESMARK 4X9 LF (GAUZE/BANDAGES/DRESSINGS) IMPLANT
BNDG GAUZE DERMACEA FLUFF 4 (GAUZE/BANDAGES/DRESSINGS) ×1 IMPLANT
CANISTER SUCT 1200ML W/VALVE (MISCELLANEOUS) IMPLANT
CHLORAPREP W/TINT 26 (MISCELLANEOUS) ×1 IMPLANT
CORD BIPOLAR FORCEPS 12FT (ELECTRODE) ×1 IMPLANT
COVER BACK TABLE 60X90IN (DRAPES) ×1 IMPLANT
COVER MAYO STAND STRL (DRAPES) ×1 IMPLANT
CUFF TOURN SGL QUICK 18X4 (TOURNIQUET CUFF) ×1 IMPLANT
DRAPE EXTREMITY T 121X128X90 (DISPOSABLE) ×1 IMPLANT
DRAPE IMP U-DRAPE 54X76 (DRAPES) ×1 IMPLANT
DRAPE OEC MINIVIEW 54X84 (DRAPES) IMPLANT
DRAPE SURG 17X23 STRL (DRAPES) ×1 IMPLANT
GAUZE PAD ABD 8X10 STRL (GAUZE/BANDAGES/DRESSINGS) IMPLANT
GAUZE SPONGE 4X4 12PLY STRL (GAUZE/BANDAGES/DRESSINGS) ×1 IMPLANT
GAUZE XEROFORM 1X8 LF (GAUZE/BANDAGES/DRESSINGS) ×1 IMPLANT
GLOVE BIO SURGEON STRL SZ7.5 (GLOVE) ×1 IMPLANT
GLOVE BIOGEL PI IND STRL 8 (GLOVE) ×1 IMPLANT
GLOVE BIOGEL PI IND STRL 8.5 (GLOVE) IMPLANT
GLOVE SURG ORTHO 8.0 STRL STRW (GLOVE) IMPLANT
GOWN STRL REUS W/ TWL LRG LVL3 (GOWN DISPOSABLE) ×1 IMPLANT
GOWN STRL REUS W/TWL XL LVL3 (GOWN DISPOSABLE) ×2 IMPLANT
IV SET EXT 30 76VOL 4 MALE LL (IV SETS) ×1 IMPLANT
NDL HYPO 22X1.5 SAFETY MO (MISCELLANEOUS) ×1 IMPLANT
NDL HYPO 25X1 1.5 SAFETY (NEEDLE) IMPLANT
NDL SAFETY ECLIPSE 18X1.5 (NEEDLE) ×1 IMPLANT
NDL SPNL 18GX3.5 QUINCKE PK (NEEDLE) IMPLANT
NEEDLE HYPO 22X1.5 SAFETY MO (MISCELLANEOUS) ×1 IMPLANT
NEEDLE HYPO 25X1 1.5 SAFETY (NEEDLE) ×1 IMPLANT
NEEDLE SPNL 18GX3.5 QUINCKE PK (NEEDLE) IMPLANT
NS IRRIG 1000ML POUR BTL (IV SOLUTION) ×1 IMPLANT
PACK BASIN DAY SURGERY FS (CUSTOM PROCEDURE TRAY) ×1 IMPLANT
PAD CAST 3X4 CTTN HI CHSV (CAST SUPPLIES) ×1 IMPLANT
PADDING CAST ABS COTTON 3X4 (CAST SUPPLIES) ×1 IMPLANT
PADDING CAST ABS COTTON 4X4 ST (CAST SUPPLIES) ×1 IMPLANT
SET SM JOINT TUBING/CANN (CANNULA) IMPLANT
SHAVER DISSECTOR 3.0 (BURR) IMPLANT
SHAVER SABRE 2.0 (BURR) IMPLANT
SLEEVE SCD COMPRESS KNEE MED (STOCKING) IMPLANT
SPLINT PLASTER CAST XFAST 3X15 (CAST SUPPLIES) IMPLANT
STOCKINETTE 4X48 STRL (DRAPES) ×1 IMPLANT
STRAP SET WRIST TOWER ACUMED (MISCELLANEOUS) ×1 IMPLANT
STRIP CLOSURE SKIN 1/2X4 (GAUZE/BANDAGES/DRESSINGS) IMPLANT
SUCTION TUBE FRAZIER 10FR DISP (SUCTIONS) IMPLANT
SUT ETHILON 4 0 PS 2 18 (SUTURE) ×1 IMPLANT
SUT MNCRL AB 4-0 PS2 18 (SUTURE) IMPLANT
SUT MON AB 5-0 PS2 18 (SUTURE) IMPLANT
SUT NYLON ETHILON 5-0 P-3 1X18 (SUTURE) IMPLANT
SUT PDS AB 2-0 CT2 27 (SUTURE) IMPLANT
SUT STEEL 4 0 (SUTURE) IMPLANT
SUT VIC AB 2-0 PS2 27 (SUTURE) IMPLANT
SUT VIC AB 4-0 P2 18 (SUTURE) IMPLANT
SUT VIC AB 4-0 PS2 18 (SUTURE) IMPLANT
SYR BULB EAR ULCER 3OZ GRN STR (SYRINGE) ×1 IMPLANT
SYR CONTROL 10ML LL (SYRINGE) ×1 IMPLANT
TOWEL GREEN STERILE FF (TOWEL DISPOSABLE) ×2 IMPLANT
TUBE CONNECTING 20X1/4 (TUBING) IMPLANT
TUBING ARTHROSCOPY IRRIG 16FT (MISCELLANEOUS) IMPLANT
UNDERPAD 30X36 HEAVY ABSORB (UNDERPADS AND DIAPERS) ×1 IMPLANT
WAND 1.5 MICROBLATOR (SURGICAL WAND) IMPLANT
WATER STERILE IRR 1000ML POUR (IV SOLUTION) ×1 IMPLANT

## 2023-09-22 NOTE — Transfer of Care (Signed)
Immediate Anesthesia Transfer of Care Note  Patient: Steven Reilly  Procedure(s) Performed: EXCISION RECURRENT VOLAR GANGLION CYST LEFT WRIST (Left: Wrist)  Patient Location: PACU  Anesthesia Type:General  Level of Consciousness: drowsy  Airway & Oxygen Therapy: Patient Spontanous Breathing and Patient connected to face mask oxygen  Post-op Assessment: Report given to RN and Post -op Vital signs reviewed and stable  Post vital signs: Reviewed and stable  Last Vitals:  Vitals Value Taken Time  BP    Temp    Pulse    Resp    SpO2      Last Pain:  Vitals:   09/22/23 0807  TempSrc: Skin  PainSc: 0-No pain      Patients Stated Pain Goal: 3 (09/22/23 0807)  Complications: No notable events documented.

## 2023-09-22 NOTE — Op Note (Signed)
NAME: Steven Reilly MEDICAL RECORD NO: [id number] DATE OF BIRTH: 06-Apr-1971 FACILITY: [facility] LOCATION: [facility] PHYSICIAN: Tami Ribas, MD   OPERATIVE REPORT   DATE OF PROCEDURE: 09/22/23    PREOPERATIVE DIAGNOSIS: Left wrist recurrent volar ganglion cyst   POSTOPERATIVE DIAGNOSIS: Left wrist recurrent volar ganglion cyst   PROCEDURE: Excision of left wrist recurrent volar ganglion cyst   SURGEON:  Betha Loa, M.D.   ASSISTANT: Cindee Salt, MD   ANESTHESIA:  General   INTRAVENOUS FLUIDS:  Per anesthesia flow sheet.   ESTIMATED BLOOD LOSS:  Minimal.   COMPLICATIONS:  None.   SPECIMENS: Left wrist ganglion to pathology   TOURNIQUET TIME:    Total Tourniquet Time Documented: Forearm (Left) - 44 minutes Total: Forearm (Left) - 44 minutes    DISPOSITION:  Stable to PACU.   INDICATIONS: 52 year old male with left wrist volar ganglion cyst.  This has been excised 3 times previously.  Continues to recur.  It is bothersome to him.  He wishes to have it removed again with possible arthroscopy as necessary.  Risks, benefits and alternatives of surgery were discussed including the risks of blood loss, infection, damage to nerves, vessels, tendons, ligaments, bone for surgery, need for additional surgery, complications with wound healing, continued pain, stiffness, , recurrence.  He voiced understanding of these risks and elected to proceed.  OPERATIVE COURSE:  After being identified preoperatively by myself,  the patient and I agreed on the procedure and site of the procedure.  The surgical site was marked.  Surgical consent had been signed. Preoperative IV antibiotic prophylaxis was given. He was transferred to the operating room and placed on the operating table in supine position with the Left upper extremity on an arm board.  General anesthesia was induced by the anesthesiologist.  Left upper extremity was prepped and draped in normal sterile orthopedic  fashion.  A surgical pause was performed between the surgeons, anesthesia, and operating room staff and all were in agreement as to the patient, procedure, and site of procedure.  Tourniquet at the proximal aspect of the forearm was inflated to 250 mmHg after exsanguination of the arm with an Esmarch bandage.  Previous incision was followed.  This was extended distally to aid in visualization.  Incision was carried in subcutaneous tissues by spreading technique.  Bipolar electrocautery is used to obtain hemostasis.  The radial artery was identified and protected throughout the case.  The cyst was easily identified.  Was filled with clear gelatinous fluid.  It was carefully freed up from surrounding tissue and scar.  The stalk of the cyst was able to be located.  It was coming from the STT joint.  The stalk was traced down to the joint and transected.  The cyst was removed and sent to pathology for examination.  The source of the cyst and stalk were debrided using the curette and synovectomy rongeurs removing any degenerative looking tissue.  The stalk was coming from underneath the FCR tendon at the STT joint.  The joint was debrided as well.  The wound was copiously irrigated with sterile saline.  Was closed with 4-0 nylon in a horizontal mattress fashion.  Was injected with quarter percent plain Marcaine to aid in postoperative analgesia.  Was then dressed with sterile Xeroform 4 x 4's and wrapped with a Kerlix bandage.  Volar splint was placed and wrapped with Kerlix and Ace bandage.  The tourniquet was deflated at 44 minutes.  Fingertips were pink with brisk capillary  refill after deflation of tourniquet.  The operative  drapes were broken down.  The patient was awoken from anesthesia safely.  He was transferred back to the stretcher and taken to PACU in stable condition.  I will see him back in the office in 1 week for postoperative followup.  I will give him a prescription for Percocet 5/325 1-2 tabs PO q6  hours prn pain, dispense # 20.   Betha Loa, MD Electronically signed, 09/22/23

## 2023-09-22 NOTE — Anesthesia Preprocedure Evaluation (Signed)
Anesthesia Evaluation  Patient identified by MRN, date of birth, ID band Patient awake    Reviewed: Allergy & Precautions, H&P , NPO status , Patient's Chart, lab work & pertinent test results  Airway Mallampati: II  TM Distance: >3 FB Neck ROM: Full    Dental  (+) Chipped,    Pulmonary neg pulmonary ROS, former smoker   Pulmonary exam normal        Cardiovascular Exercise Tolerance: Good negative cardio ROS Normal cardiovascular exam     Neuro/Psych negative neurological ROS  negative psych ROS   GI/Hepatic negative GI ROS, Neg liver ROS,,,  Endo/Other  negative endocrine ROS    Renal/GU negative Renal ROS  negative genitourinary   Musculoskeletal negative musculoskeletal ROS (+)    Abdominal   Peds  Hematology negative hematology ROS (+)   Anesthesia Other Findings   Reproductive/Obstetrics negative OB ROS                              Anesthesia Physical Anesthesia Plan  ASA: 2  Anesthesia Plan: General   Post-op Pain Management:    Induction: Intravenous  PONV Risk Score and Plan: 2 and Ondansetron, Dexamethasone, Midazolam and Treatment may vary due to age or medical condition  Airway Management Planned: LMA  Additional Equipment:   Intra-op Plan:   Post-operative Plan: Extubation in OR  Informed Consent: I have reviewed the patients History and Physical, chart, labs and discussed the procedure including the risks, benefits and alternatives for the proposed anesthesia with the patient or authorized representative who has indicated his/her understanding and acceptance.     Dental advisory given  Plan Discussed with: CRNA  Anesthesia Plan Comments:         Anesthesia Quick Evaluation

## 2023-09-22 NOTE — Anesthesia Postprocedure Evaluation (Signed)
Anesthesia Post Note  Patient: Steven Reilly  Procedure(s) Performed: EXCISION RECURRENT VOLAR GANGLION CYST LEFT WRIST (Left: Wrist)     Patient location during evaluation: PACU Anesthesia Type: General Level of consciousness: awake and alert Pain management: pain level controlled Vital Signs Assessment: post-procedure vital signs reviewed and stable Respiratory status: spontaneous breathing, nonlabored ventilation, respiratory function stable and patient connected to nasal cannula oxygen Cardiovascular status: blood pressure returned to baseline and stable Postop Assessment: no apparent nausea or vomiting Anesthetic complications: no   No notable events documented.  Last Vitals:  Vitals:   09/22/23 1230 09/22/23 1257  BP: 116/74 118/84  Pulse: 66 71  Resp: 17 18  Temp:  36.6 C  SpO2: 100% 99%    Last Pain:  Vitals:   09/22/23 1257  TempSrc: Temporal  PainSc: 0-No pain                 Marie Chow

## 2023-09-22 NOTE — H&P (Signed)
Steven Reilly is an 52 y.o. male.   Chief Complaint: recurrent volar ganglion cyst HPI: 52 yo male with recurrent volar ganglion cyst left wrist.  This has been excised three times previously with recurrence.  He wishes to have repeat excision of the cyst with possible wrist arthroscopy.  Allergies:  Allergies  Allergen Reactions   Prednisone     "makes me uncomfortable"    Past Medical History:  Diagnosis Date   Hearing loss in left ear    High cholesterol    Lyme disease    Seizure-like activity (HCC)    Syncope    Trigeminal neuralgia     Past Surgical History:  Procedure Laterality Date   APPENDECTOMY  1986   GANGLION CYST EXCISION Left 12/2015   GANGLION CYST EXCISION Left 12/30/2022   Procedure: EXCISION LEFT WRIST RECURRENT VOLAR GANGLION;  Surgeon: Betha Loa, MD;  Location: [facility];  Service: Orthopedics;  Laterality: Left;  60 MIN   MASS EXCISION Left 09/27/2017   Procedure: EXCISION LEFT WRIST RECURRENT VOLAR GANGLION CYST;  Surgeon: Betha Loa, MD;  Location: [facility];  Service: Orthopedics;  Laterality: Left;    Family History: Family History  Problem Relation Age of Onset   Emphysema Maternal Aunt        smoked   Allergies Unknown        "everybody"   Heart disease Father    Melanoma Father    Prostate cancer Father     Social History:   reports that he quit smoking about 8 years ago. His smoking use included cigarettes. He started smoking about 21 years ago. He has a 9.8 pack-year smoking history. He has never used smokeless tobacco. He reports current alcohol use. He reports that he does not use drugs.  Medications: Medications Prior to Admission  Medication Sig Dispense Refill   atorvastatin (LIPITOR) 10 MG tablet Take by mouth daily.     cholecalciferol (VITAMIN D3) 25 MCG (1000 UNIT) tablet Take 1,000 Units by mouth daily.     carbamazepine (TEGRETOL) 200 MG tablet Take by mouth.      HYDROcodone-acetaminophen (NORCO/VICODIN) 5-325 MG tablet 1-2 tabs PO q6 hours prn pain 15 tablet 0   ibuprofen (ADVIL) 200 MG tablet Take 200 mg by mouth every 6 (six) hours as needed for mild pain.     pregabalin (LYRICA) 100 MG capsule Take 100 mg by mouth.      No results found for this or any previous visit (from the past 48 hour(s)).  No results found.    Height 5\' 11"  (1.803 m), weight 74.8 kg.  General appearance: alert, cooperative, and appears stated age Head: Normocephalic, without obvious abnormality, atraumatic Neck: supple, symmetrical, trachea midline Extremities: Intact sensation and capillary refill all digits.  +epl/fpl/io.  No wounds.  Pulses: 2+ and symmetric Skin: Skin color, texture, turgor normal. No rashes or lesions Neurologic: Grossly normal Incision/Wound: none  Assessment/Plan Left wrist recurrent volar ganglion cyst.  Non operative and operative treatment options have been discussed with the patient and patient wishes to proceed with operative treatment. Risks, benefits, and alternatives of surgery have been discussed and the patient agrees with the plan of care.   Betha Loa 09/22/2023, 7:52 AM

## 2023-09-22 NOTE — Op Note (Signed)
I assisted Surgeons and Role:    * Betha Loa, MD - Primary    Cindee Salt, MD - Assisting on the Procedure(s): EXCISION RECURRENT VOLAR GANGLION CYST LEFT WRIST on 09/22/2023.  I provided assistance on this case as follows: Set up, approach, identification of the cyst, protection of the radial artery after identification, isolation of the cyst, identification of the stomach, removal of the stalkdebridement of the joint, the wound and application of the dressing and splint  Electronically signed by: Cindee Salt, MD Date: 09/22/2023 Time: 11:40 AM

## 2023-09-22 NOTE — Discharge Instructions (Addendum)
[facility] Instructions °Hand Surgery ° °Wound Care: °Keep your hand elevated above the level of your heart.  Do not allow it to dangle by your side.  Keep the dressing dry and do not remove it unless your doctor advises you to do so.  He will usually change it at the time of your post-op visit.  Moving your fingers is advised to stimulate circulation but will depend on the site of your surgery.  If you have a splint applied, your doctor will advise you regarding movement. ° °Activity: °Do not drive or operate machinery today.  Rest today and then you may return to your normal activity and work as indicated by your physician. ° °Diet:  °Drink liquids today or eat a light diet.  You may resume a regular diet tomorrow.   ° °General expectations: °Pain for two to three days. °Fingers may become slightly swollen. ° °Call your doctor if any of the following occur: °Severe pain not relieved by pain medication. °Elevated temperature. °Dressing soaked with blood. °Inability to move fingers. °White or bluish color to fingers. ° ° °Post Anesthesia Home Care Instructions ° °Activity: °Get plenty of rest for the remainder of the day. A responsible individual must stay with you for 24 hours following the procedure.  °For the next 24 hours, DO NOT: °-Drive a car °-Operate machinery °-Drink alcoholic beverages °-Take any medication unless instructed by your physician °-Make any legal decisions or sign important papers. ° °Meals: °Start with liquid foods such as gelatin or soup. Progress to regular foods as tolerated. Avoid greasy, spicy, heavy foods. If nausea and/or vomiting occur, drink only clear liquids until the nausea and/or vomiting subsides. Call your physician if vomiting continues. ° °Special Instructions/Symptoms: °Your throat may feel dry or sore from the anesthesia or the breathing tube placed in your throat during surgery. If this causes discomfort, gargle with warm salt water. The discomfort should disappear within  24 hours. ° °If you had a scopolamine patch placed behind your ear for the management of post- operative nausea and/or vomiting: ° °1. The medication in the patch is effective for 72 hours, after which it should be removed.  Wrap patch in a tissue and discard in the trash. Wash hands thoroughly with soap and water. °2. You may remove the patch earlier than 72 hours if you experience unpleasant side effects which may include dry mouth, dizziness or visual disturbances. °3. Avoid touching the patch. Wash your hands with soap and water after contact with the patch. °  ° ° °

## 2023-09-22 NOTE — Anesthesia Procedure Notes (Signed)
Procedure Name: LMA Insertion Date/Time: 09/22/2023 10:28 AM  Performed by: Roosvelt Harps, CRNAPre-anesthesia Checklist: Patient identified, Emergency Drugs available, Suction available, Patient being monitored and Timeout performed Patient Re-evaluated:Patient Re-evaluated prior to induction Oxygen Delivery Method: Circle system utilized Preoxygenation: Pre-oxygenation with 100% oxygen Induction Type: IV induction Ventilation: Mask ventilation without difficulty LMA: LMA inserted LMA Size: 5.0 Placement Confirmation: positive ETCO2 and breath sounds checked- equal and bilateral Dental Injury: Teeth and Oropharynx as per pre-operative assessment

## 2023-09-23 ENCOUNTER — Encounter (HOSPITAL_BASED_OUTPATIENT_CLINIC_OR_DEPARTMENT_OTHER): Payer: Self-pay | Admitting: Orthopedic Surgery

## 2023-09-23 LAB — SURGICAL PATHOLOGY

## 2024-06-17 ENCOUNTER — Ambulatory Visit
Admission: RE | Admit: 2024-06-17 | Discharge: 2024-06-17 | Disposition: A | Discharge status: Home / Self Care | Source: Ambulatory Visit

## 2024-06-17 VITALS — BP 112/73 | HR 75 | Temp 97.7°F | Resp 18 | Ht 71.0 in | Wt 160.0 lb

## 2024-06-17 DIAGNOSIS — R21 Rash and other nonspecific skin eruption: Secondary | ICD-10-CM | POA: Diagnosis not present

## 2024-06-17 DIAGNOSIS — R5382 Chronic fatigue, unspecified: Secondary | ICD-10-CM | POA: Insufficient documentation

## 2024-06-17 DIAGNOSIS — Z207 Contact with and (suspected) exposure to pediculosis, acariasis and other infestations: Secondary | ICD-10-CM | POA: Diagnosis not present

## 2024-06-17 HISTORY — DX: Gastro-esophageal reflux disease without esophagitis: K21.9

## 2024-06-17 HISTORY — DX: Allergy, unspecified, initial encounter: T78.40XA

## 2024-06-17 HISTORY — DX: Unspecified convulsions: R56.9

## 2024-06-17 HISTORY — DX: Chronic obstructive pulmonary disease, unspecified: J44.9

## 2024-06-17 MED ORDER — TRIAMCINOLONE ACETONIDE 0.1 % EX OINT: 1.0000 | TOPICAL_OINTMENT | Freq: Two times a day (BID) | CUTANEOUS | 0 refills | Status: AC

## 2024-06-17 MED ORDER — PERMETHRIN 5 % EX CREA: 1.0000 | TOPICAL_CREAM | Freq: Once | CUTANEOUS | 0 refills | Status: AC

## 2024-06-17 NOTE — Discharge Instructions (Signed)
  1. Scabies exposure (Primary) - permethrin  (ELIMITE ) 5 % cream; Apply 1 Application topically once for 1 dose.  Dispense: 60 g; Refill: 0  2. Rash and nonspecific skin eruption - triamcinolone  ointment (KENALOG ) 0.1 %; Apply 1 Application topically 2 (two) times daily.  Dispense: 30 g; Refill: 0 - If symptoms continue despite current treatment follow-up with dermatology for further evaluation and management.

## 2024-06-17 NOTE — ED Provider Notes (Signed)
 [facility]  Note:  This document was prepared using Conservation officer, historic buildings and may include unintentional dictation errors.  MRN: [id number] DOB: 1970/12/30  Subjective:   Steven Reilly is a 53 y.o. male presenting for pruritic skin rash over chest, abdomen and buttocks x 1 week.  Patient reports that he was exposed to a family member with an advanced infection with scabies 6 weeks ago and was concerned that possibly he was developing scabies infection.  Patient reports that the rash is red spots over several areas of his body, extremely itchy, worse at night, without any distinct tunneling consistent with scabies infection.  Patient requesting skin testing for scabies in urgent care, patient advised that urgent care does not have skin testing for scabies.  Patient has not been using any over-the-counter rash or anti-itch cream.  No current facility-administered medications for this encounter.  Current Outpatient Medications:    albuterol (VENTOLIN HFA) 108 (90 Base) MCG/ACT inhaler, Inhale 2 puffs into the lungs every 6 (six) hours as needed., Disp: , Rfl:    amoxicillin (AMOXIL) 875 MG tablet, Take 875 mg by mouth 2 (two) times daily., Disp: , Rfl:    atorvastatin (LIPITOR) 10 MG tablet, Take by mouth daily., Disp: , Rfl:    azithromycin (ZITHROMAX) 250 MG tablet, Take 250 mg by mouth as directed., Disp: , Rfl:    carbamazepine (TEGRETOL) 200 MG tablet, Take by mouth., Disp: , Rfl:    cefadroxil (DURICEF) 500 MG capsule, Take by mouth daily., Disp: , Rfl:    chlorhexidine  (PERIDEX ) 0.12 % solution, , Disp: , Rfl:    clindamycin (CLEOCIN T) 1 % lotion, Apply topically 2 (two) times daily as needed., Disp: , Rfl:    clindamycin (CLEOCIN) 300 MG capsule, Take 300 mg by mouth 3 (three) times daily., Disp: , Rfl:    clonazePAM (KLONOPIN) 0.5 MG tablet, Take 0.25 mg by mouth as needed., Disp: , Rfl:    mupirocin ointment (BACTROBAN) 2 %, Apply 1 Application topically 2 (two)  times daily., Disp: , Rfl:    oxyCODONE  (OXY IR/ROXICODONE ) 5 MG immediate release tablet, Take by mouth., Disp: , Rfl:    permethrin  (ELIMITE ) 5 % cream, Apply 1 Application topically once for 1 dose., Disp: 60 g, Rfl: 0   predniSONE (DELTASONE) 20 MG tablet, Take 20 mg by mouth 2 (two) times daily., Disp: , Rfl:    triamcinolone  ointment (KENALOG ) 0.1 %, Apply 1 Application topically 2 (two) times daily., Disp: 30 g, Rfl: 0   Vitamin D , Ergocalciferol , (DRISDOL) 1.25 MG (50000 UNIT) CAPS capsule, Take 5,000 Units by mouth every 7 (seven) days., Disp: , Rfl:    cholecalciferol (VITAMIN D3) 25 MCG (1000 UNIT) tablet, Take 1,000 Units by mouth daily., Disp: , Rfl:    doxycycline (VIBRAMYCIN) 100 MG capsule, Take 100 mg by mouth 2 (two) times daily., Disp: , Rfl:    ibuprofen (ADVIL) 200 MG tablet, Take 200 mg by mouth every 6 (six) hours as needed for mild pain., Disp: , Rfl:    oxyCODONE -acetaminophen  (PERCOCET) 5-325 MG tablet, 1-2 tabs po q6 hours prn pain, Disp: 20 tablet, Rfl: 0   pregabalin (LYRICA) 100 MG capsule, Take 100 mg by mouth., Disp: , Rfl:   Facility-Administered Medications Ordered in Other Encounters:    gadopentetate dimeglumine  (MAGNEVIST ) injection 17 mL, 17 mL, Intravenous, Once PRN, Onita Duos, MD   Allergies  Allergen Reactions   Prednisone Hives, Itching and Other (See Comments)    makes me uncomfortable  Doesn't like how he feels while on prednisone but does not have a true allergy.   Also: irritability, insomnia  makes me uncomfortable    Also: irritability, insomnia  makes me uncomfortable, , Also: irritability, insomnia    Past Medical History:  Diagnosis Date   Allergy    COPD (chronic obstructive pulmonary disease) (HCC)    GERD (gastroesophageal reflux disease)    Hearing loss in left ear    High cholesterol    Lyme disease    Seizure-like activity (HCC)    Seizures (HCC)    Syncope    Trigeminal neuralgia      Past Surgical History:   Procedure Laterality Date   APPENDECTOMY  1986   GANGLION CYST EXCISION Left 12/2015   GANGLION CYST EXCISION Left 12/30/2022   Procedure: EXCISION LEFT WRIST RECURRENT VOLAR GANGLION;  Surgeon: Murrell Drivers, MD;  Location: [facility];  Service: Orthopedics;  Laterality: Left;  60 MIN   GANGLION CYST EXCISION Left 09/22/2023   Procedure: EXCISION RECURRENT VOLAR GANGLION CYST LEFT WRIST;  Surgeon: Murrell Drivers, MD;  Location: [facility];  Service: Orthopedics;  Laterality: Left;  60 MIN   MASS EXCISION Left 09/27/2017   Procedure: EXCISION LEFT WRIST RECURRENT VOLAR GANGLION CYST;  Surgeon: Murrell Drivers, MD;  Location: [facility];  Service: Orthopedics;  Laterality: Left;    Family History  Problem Relation Age of Onset   Emphysema Maternal Aunt        smoked   Allergies Other        everybody   Heart disease Father    Melanoma Father    Prostate cancer Father    Cancer Father    Heart disease Mother    Stroke Mother    Cancer Paternal Grandmother     Social History   Tobacco Use   Smoking status: Former    Current packs/day: 0.00    Average packs/day: 0.8 packs/day for 13.0 years (9.8 ttl pk-yrs)    Types: Cigarettes    Start date: 10/25/2001    Quit date: 10/25/2014    Years since quitting: 9.6   Smokeless tobacco: Never  Vaping Use   Vaping status: Former   Substances: Nicotine, Flavoring  Substance Use Topics   Alcohol use: Yes    Alcohol/week: 0.0 standard drinks of alcohol    Comment: 3-5 servings per week   Drug use: Not Currently    Types: Marijuana    ROS Refer to HPI for ROS details.  Objective:   Vitals: BP 112/73 (BP Location: Right Arm)   Pulse 75   Temp 97.7 F (36.5 C) (Oral)   Resp 18   Ht 5' 11 (1.803 m)   Wt 160 lb (72.6 kg)   SpO2 98%   BMI 22.32 kg/m   Physical Exam Vitals and nursing note reviewed.  Constitutional:      General: He is not in acute distress.    Appearance: Normal  appearance. He is well-developed. He is not ill-appearing or toxic-appearing.  HENT:     Head: Normocephalic.  Cardiovascular:     Rate and Rhythm: Normal rate.  Pulmonary:     Effort: Pulmonary effort is normal. No respiratory distress.  Skin:    General: Skin is warm and dry.     Findings: Erythema and rash present. Rash is papular.  Neurological:     General: No focal deficit present.     Mental Status: He is alert and oriented to  person, place, and time.  Psychiatric:        Mood and Affect: Mood normal.        Behavior: Behavior normal.     Procedures  No results found for this or any previous visit (from the past 24 hours).  No results found.   Assessment and Plan :     Discharge Instructions       1. Scabies exposure (Primary) - permethrin  (ELIMITE ) 5 % cream; Apply 1 Application topically once for 1 dose.  Dispense: 60 g; Refill: 0  2. Rash and nonspecific skin eruption - triamcinolone  ointment (KENALOG ) 0.1 %; Apply 1 Application topically 2 (two) times daily.  Dispense: 30 g; Refill: 0 - If symptoms continue despite current treatment follow-up with dermatology for further evaluation and management.       Maddelynn Moosman B Jaylia Pettus   Kamorie Aldous, [place], [place] 06/17/24 [phone]

## 2024-06-17 NOTE — ED Triage Notes (Signed)
 I was exposed to a person with advanced, CRUSTED SCABIES about 6 weeks ago and recently developed severe itching at night and now a rash (red spots on right thigh, right chest).  I need a skin test for SCABIES. - Entered by patient

## 2024-06-20 ENCOUNTER — Ambulatory Visit: Admitting: Physician Assistant
